# Patient Record
Sex: Female | Born: 1990 | Race: White | Hispanic: No | Marital: Single | Smoking: Current every day smoker
Health system: Southern US, Community
[De-identification: ages and names within clinical notes are randomized; demographics above are authoritative.]

## PROBLEM LIST (undated history)

## (undated) ENCOUNTER — Inpatient Hospital Stay (HOSPITAL_COMMUNITY): Payer: Self-pay

## (undated) ENCOUNTER — Emergency Department (HOSPITAL_COMMUNITY): Payer: Self-pay

## (undated) DIAGNOSIS — N83209 Unspecified ovarian cyst, unspecified side: Secondary | ICD-10-CM

## (undated) DIAGNOSIS — R87629 Unspecified abnormal cytological findings in specimens from vagina: Secondary | ICD-10-CM

## (undated) DIAGNOSIS — B977 Papillomavirus as the cause of diseases classified elsewhere: Secondary | ICD-10-CM

## (undated) DIAGNOSIS — F419 Anxiety disorder, unspecified: Secondary | ICD-10-CM

## (undated) DIAGNOSIS — B029 Zoster without complications: Secondary | ICD-10-CM

## (undated) HISTORY — PX: LACERATION REPAIR: SHX5168

## (undated) HISTORY — PX: CERVICAL BIOPSY: SHX590

---
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
## 2008-03-19 ENCOUNTER — Inpatient Hospital Stay (HOSPITAL_COMMUNITY): Admission: AD | Admit: 2008-03-19 | Discharge: 2008-03-19 | Payer: Self-pay | Admitting: Obstetrics & Gynecology

## 2008-04-01 ENCOUNTER — Ambulatory Visit (HOSPITAL_COMMUNITY): Admission: RE | Admit: 2008-04-01 | Discharge: 2008-04-01 | Payer: Self-pay | Admitting: Internal Medicine

## 2008-04-07 ENCOUNTER — Inpatient Hospital Stay (HOSPITAL_COMMUNITY): Admission: AD | Admit: 2008-04-07 | Discharge: 2008-04-07 | Payer: Self-pay | Admitting: Obstetrics & Gynecology

## 2008-04-11 ENCOUNTER — Emergency Department (HOSPITAL_COMMUNITY): Admission: EM | Admit: 2008-04-11 | Discharge: 2008-04-11 | Payer: Self-pay | Admitting: Emergency Medicine

## 2008-05-01 ENCOUNTER — Inpatient Hospital Stay (HOSPITAL_COMMUNITY): Admission: AD | Admit: 2008-05-01 | Discharge: 2008-05-01 | Payer: Self-pay | Admitting: Obstetrics & Gynecology

## 2008-07-11 ENCOUNTER — Ambulatory Visit: Payer: Self-pay | Admitting: Family Medicine

## 2008-07-11 ENCOUNTER — Inpatient Hospital Stay (HOSPITAL_COMMUNITY): Admission: AD | Admit: 2008-07-11 | Discharge: 2008-07-12 | Payer: Self-pay | Admitting: Obstetrics & Gynecology

## 2008-07-17 ENCOUNTER — Inpatient Hospital Stay (HOSPITAL_COMMUNITY): Admission: AD | Admit: 2008-07-17 | Discharge: 2008-07-17 | Payer: Self-pay | Admitting: Obstetrics & Gynecology

## 2008-07-25 ENCOUNTER — Ambulatory Visit: Payer: Self-pay | Admitting: Advanced Practice Midwife

## 2008-07-25 ENCOUNTER — Inpatient Hospital Stay (HOSPITAL_COMMUNITY): Admission: AD | Admit: 2008-07-25 | Discharge: 2008-07-25 | Payer: Self-pay | Admitting: Obstetrics & Gynecology

## 2008-07-26 ENCOUNTER — Inpatient Hospital Stay (HOSPITAL_COMMUNITY): Admission: AD | Admit: 2008-07-26 | Discharge: 2008-07-29 | Payer: Self-pay | Admitting: Obstetrics & Gynecology

## 2008-07-26 ENCOUNTER — Ambulatory Visit: Payer: Self-pay | Admitting: Advanced Practice Midwife

## 2008-08-31 ENCOUNTER — Emergency Department (HOSPITAL_COMMUNITY): Admission: EM | Admit: 2008-08-31 | Discharge: 2008-08-31 | Payer: Self-pay | Admitting: Emergency Medicine

## 2008-10-05 ENCOUNTER — Emergency Department (HOSPITAL_COMMUNITY): Admission: EM | Admit: 2008-10-05 | Discharge: 2008-10-05 | Payer: Self-pay | Admitting: Emergency Medicine

## 2010-02-08 DIAGNOSIS — N83209 Unspecified ovarian cyst, unspecified side: Secondary | ICD-10-CM

## 2010-02-08 HISTORY — DX: Unspecified ovarian cyst, unspecified side: N83.209

## 2010-05-18 LAB — CBC
HCT: 28.3 % — ABNORMAL LOW (ref 36.0–46.0)
Hemoglobin: 9.8 g/dL — ABNORMAL LOW (ref 12.0–15.0)
MCHC: 34.6 g/dL (ref 30.0–36.0)
MCV: 93.8 fL (ref 78.0–100.0)
Platelets: 301 10*3/uL (ref 150–400)
RBC: 3.01 MIL/uL — ABNORMAL LOW (ref 3.87–5.11)
RDW: 13.4 % (ref 11.5–15.5)
RDW: 13.6 % (ref 11.5–15.5)
WBC: 12.1 10*3/uL — ABNORMAL HIGH (ref 4.0–10.5)

## 2010-05-18 LAB — RPR: RPR Ser Ql: NONREACTIVE

## 2010-05-18 LAB — WET PREP, GENITAL
Clue Cells Wet Prep HPF POC: NONE SEEN
Trich, Wet Prep: NONE SEEN

## 2010-05-18 LAB — GC/CHLAMYDIA PROBE AMP, URINE: GC Probe Amp, Urine: NEGATIVE

## 2010-06-21 ENCOUNTER — Emergency Department (HOSPITAL_COMMUNITY): Payer: Self-pay

## 2010-06-21 ENCOUNTER — Emergency Department (HOSPITAL_COMMUNITY)
Admission: EM | Admit: 2010-06-21 | Discharge: 2010-06-21 | Disposition: A | Discharge status: Home / Self Care | Payer: Self-pay | Attending: Emergency Medicine | Admitting: Emergency Medicine

## 2010-06-21 DIAGNOSIS — L910 Hypertrophic scar: Secondary | ICD-10-CM | POA: Insufficient documentation

## 2010-06-21 DIAGNOSIS — M545 Low back pain, unspecified: Secondary | ICD-10-CM | POA: Insufficient documentation
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
## 2010-06-26 NOTE — Op Note (Signed)
[name], [name]                 ACCOUNT NO.:  [id number]   MEDICAL RECORD NO.:  [id number]          PATIENT TYPE:  EMS   LOCATION:  ED                           FACILITY:  [facility]   PHYSICIAN:  Antony Contras, MD     DATE OF BIRTH:  [birth year]   DATE OF PROCEDURE:  09/08/2005  DATE OF DISCHARGE:                                 OPERATIVE REPORT   PREOPERATIVE DIAGNOSIS:  Left postauricular abscess.   POSTOPERATIVE DIAGNOSIS:  Left postauricular abscess.   PROCEDURE:  Incision and drainage of left postauricular abscess.   SURGEON:  Dr. Christia Reading.   ANESTHESIA:  Topical ice application.   COMPLICATIONS:  None.   INDICATIONS:  The patient is a 20 year old white female with a several-week  history of left ear pain associated with a knot behind the ear that has  worsened.  She is found to have abscess in the postauricular region  associated with acute mastoiditis.  This did not appear to be a  subperiosteal abscess but more of a soft tissue abscess perhaps associated  with some degree of otitis externa.  She will be admitted to hospital but is  undergoing incision and drainage of the abscess in the emergency department.   FINDINGS:  Approximately 3 mL of pus was expressed from the abscess after  incision.  The area was irrigated copiously.   DESCRIPTION OF PROCEDURE:  The patient was identified in the emergency  department and informed consent was obtained from her grandfather and aunt.  The postauricular region was iced for approximately 10 minutes.  Local  anesthetic was not used because of the nature of the abscess and this  medicine being ineffective for pain control.  The patient was given 10 mg of  intravenous morphine prior to the procedure.  The postauricular region was  then prepped with Betadine.  A vertical incision was then made with a 15  blade scalpel into the abscess and pus was immediately expressed.  A culture  swab was obtained and sent for culture.  Pus was  expressed out and the  abscess cavity was copiously irrigated with saline using an angiocath on a  syringe.  40 mL of saline was used.  A quarter inch Penrose drain was  inserted into the abscess cavity and secured to the skin using 3-0 nylon  suture.  A fluff dressing was then applied.  The patient tolerated procedure  well with a fair amount of pain during the procedure.      Antony Contras, MD  Electronically Signed     DDB/MEDQ  D:  09/08/2005  T:  09/09/2005  Job:  [id number]

## 2010-06-26 NOTE — H&P (Signed)
[name], [name]                 ACCOUNT NO.:  [id number]   MEDICAL RECORD NO.:  [id number]          PATIENT TYPE:  EMS   LOCATION:  ED                           FACILITY:  [facility]   PHYSICIAN:  Antony Contras, MD     DATE OF BIRTH:  1990-03-30   DATE OF ADMISSION:  09/08/2005  DATE OF DISCHARGE:                                HISTORY & PHYSICAL   CHIEF COMPLAINT:  Left ear pain.   HISTORY OF PRESENT ILLNESS:  Patient is a 20 year old white female who  experienced pain in the left ear a few weeks ago after spending time at  [employer].  She had a little bit of clear drainage initially  that turned into a thick green drainage.  This subsided.  About a week ago,  she began feeling a knot behind the left ear and has had worsening pain  since then.  Her hearing is decreased in the left ear a bit, and she hears  her voice in the ear as an echoing sound.  She has not had anymore drainage.  She has not sought medical attention until coming to the emergency room  today.  She has been using Tylenol #3 for pain but not any other  medications.  She denies fevers, chills, or dizziness.   PAST MEDICAL HISTORY:  None.   PAST SURGICAL HISTORY:  None.   MEDICATIONS:  1.  Prozac.  2.  Vistaril.   ALLERGIES:  No known drug allergies.   FAMILY HISTORY:  Cervical cancer.   SOCIAL HISTORY:  Patient smokes less than one pack of cigarettes per day.  She denies alcohol or drug use.  Guardianship is unclear.  Her grandfather  and two aunts all are involved in her care, but her parents are not.   REVIEW OF SYSTEMS:  Negative except as listed above.   PHYSICAL EXAMINATION:  VITAL SIGNS:  Temperature 98.3, blood pressure  121/46, pulse 64, respirations 20.  GENERAL:  The patient is in clear discomfort but calm and cooperative.  She  does not want to stay in the hospital.  HEENT:  Ears:  The left external ear is slightly protruding from the head,  and there is a fluctuant area of  swelling involving the left postauricular  region just behind the ear lobe.  This is tender to touch, as is the  remainder of the postauricular region.  The tragus is not tender.  External  canal is patent with no otorrhea.  Tympanic membrane is intact with a serous  and purulent effusion.  The right ear exam is normal.  Nose:  Nasal passages  are patent with mild inferior turbinate hypertrophy and a relatively  straight septum.  The patient has a right nasal piercing.  Oral  cavity/Oropharynx:  The patient has a tongue piercing.  Mucosa is otherwise  normal.  Tonsils are 1-2+ bilaterally.  Eyes:  Extraocular movements are  intact.  Pupils are equal, round and reactive to light.  NECK:  The patient has tenderness in the left upper sternocleidomastoid  muscle, but otherwise the neck  is nontender.  There is no mass.  The patient  has some slightly enlarged lymph nodes in the left upper neck.   RADIOLOGIC EXAM:  A  temporal bone CT scan was personally reviewed and  demonstrates opacification of many of the left mastoid air cells with  several small pockets of air remaining.  The middle ear space has partial  soft tissue density.  The post auricular abscess is not clearly defined on  the scan, although there is marked swelling in this region.  There is no  bony destruction, either in the mastoid or on the mastoid cortex.   LABORATORY STUDIES:  White blood count 13.1 with 68% neutrophils, hemoglobin  13.1, platelets 440.   ASSESSMENT:  The patient is a 20 year old white female with a left  postauricular abscess associated with acute mastoiditis.  She also is  experiencing hearing loss associated with effusion.   PLAN:  The left post auricular abscess will be incised and drained in the  emergency department.  She will then be admitted to the hospital for  intravenous antibiotic therapy, consisting of Unasyn.  The pain will be  controlled with oral and IV medication.  I anticipate at least  one night in  the hospital and perhaps more if she continues to have bad symptoms.      Antony Contras, MD  Electronically Signed     DDB/MEDQ  D:  09/08/2005  T:  09/08/2005  Job:  [id number]

## 2010-06-26 NOTE — Discharge Summary (Signed)
Blake, Donna                 ACCOUNT NO.:  [id number]   MEDICAL RECORD NO.:  [id number]          PATIENT TYPE:  INP   LOCATION:  9135                          FACILITY:  [facility]   PHYSICIAN:  James A. Ashley Royalty, M.D.DATE OF BIRTH:  04-02-1990   DATE OF ADMISSION:  09/02/2006  DATE OF DISCHARGE:  09/04/2006                               DISCHARGE SUMMARY   DISCHARGE DIAGNOSES:  1. Intrauterine pregnancy at 38+ weeks gestation, delivered.  2. Term birth living child, vertex.  3. Anemia - asymptomatic.   OPERATIONS AND PROCEDURES:  OB delivery with vacuum extractor.   CONSULTATIONS:  None.   DISCHARGE MEDICATIONS:  Percocet, Motrin 60 mg.   HISTORY AND PHYSICAL:  This is a 20 year old primigravida at 38+ weeks  gestation.  Prenatal care was complicated by smoking and noncompliance  with OB/GYN care.  The patient presented to the office on the morning of  admission complaining of vague abdominal pain.  Vaginal examination  revealed 3-4 cm dilatation.  She was sent to the maternity admissions  and noted to be having regular contractions.  For the remainder of the  history and physical, please see chart.   HOSPITAL COURSE:  The patient was admitted to [facility].  Admission laboratory studies were drawn.  She went on to  labor and deliver on September 02, 2006.  The infant was 6 pound 14 ounce  female, Apgars 7 at 1 minute and 9 at 5 minutes sent to the newborn  nursery.  Delivery was accomplished by Dr. Sylvester Harder with the  vacuum extractor due to variable decelerations during the late second  stage.  The patient's postoperative course was benign.  She complained  of a sore throat on September 04, 2006.  However, subsequent strep culture  was negative.  She was felt to be stable for discharge on September 04, 2006,  and was discharged home afebrile and in satisfactory condition.   DISPOSITION:  The patient is to return to [facility] and  Obstetrics in six weeks  for postpartum evaluation.      James A. Ashley Royalty, M.D.  Electronically Signed     JAM/MEDQ  D:  10/11/2006  T:  10/11/2006  Job:  [id number]

## 2010-11-06 LAB — POCT I-STAT, CHEM 8
Calcium, Ion: 1.16
Creatinine, Ser: 0.8
HCT: 40
TCO2: 24

## 2010-11-06 LAB — COMPREHENSIVE METABOLIC PANEL
Albumin: 3.8
Alkaline Phosphatase: 65
Calcium: 8.9
Creatinine, Ser: 0.71
GFR calc Af Amer: >60
Glucose, Bld: 115 — ABNORMAL HIGH
Potassium: 3.4 — ABNORMAL LOW
Sodium: 140
Total Bilirubin: 0.6
Total Protein: 6.3

## 2010-11-06 LAB — CBC
HCT: 38.1
Hemoglobin: 12.7
MCHC: 33.3
MCV: 91.1
RDW: 15

## 2010-11-06 LAB — RAPID URINE DRUG SCREEN, HOSP PERFORMED
Amphetamines: NOT DETECTED
Benzodiazepines: POSITIVE — AB
Tetrahydrocannabinol: POSITIVE — AB

## 2010-11-06 LAB — SAMPLE TO BLOOD BANK

## 2010-11-10 LAB — URINALYSIS, ROUTINE W REFLEX MICROSCOPIC
Hgb urine dipstick: NEGATIVE
Ketones, ur: 40 — AB
Leukocytes, UA: NEGATIVE
Protein, ur: 30 — AB
Urobilinogen, UA: 0.2

## 2010-11-10 LAB — URINE MICROSCOPIC-ADD ON

## 2010-11-10 LAB — POCT PREGNANCY, URINE: Preg Test, Ur: POSITIVE

## 2010-11-15 ENCOUNTER — Emergency Department (HOSPITAL_COMMUNITY)
Admission: EM | Admit: 2010-11-15 | Discharge: 2010-11-15 | Disposition: A | Discharge status: Home / Self Care | Payer: Self-pay | Attending: Emergency Medicine | Admitting: Emergency Medicine

## 2010-11-15 DIAGNOSIS — R109 Unspecified abdominal pain: Secondary | ICD-10-CM | POA: Insufficient documentation

## 2010-11-15 DIAGNOSIS — R197 Diarrhea, unspecified: Secondary | ICD-10-CM | POA: Insufficient documentation

## 2010-11-15 DIAGNOSIS — R112 Nausea with vomiting, unspecified: Secondary | ICD-10-CM | POA: Insufficient documentation

## 2010-11-15 LAB — URINALYSIS, ROUTINE W REFLEX MICROSCOPIC
Hgb urine dipstick: NEGATIVE
Ketones, ur: NEGATIVE mg/dL
Leukocytes, UA: NEGATIVE
Protein, ur: NEGATIVE mg/dL
Urobilinogen, UA: 0.2 mg/dL (ref 0.0–1.0)

## 2010-11-15 LAB — COMPREHENSIVE METABOLIC PANEL
ALT: 10 U/L (ref 0–35)
AST: 13 U/L (ref 0–37)
Albumin: 3.8 g/dL (ref 3.5–5.2)
CO2: 24 mEq/L (ref 19–32)
Calcium: 8.5 mg/dL (ref 8.4–10.5)
Creatinine, Ser: 0.52 mg/dL (ref 0.50–1.10)
GFR calc non Af Amer: >90 mL/min (ref >90)
Sodium: 138 mEq/L (ref 135–145)
Total Protein: 6.7 g/dL (ref 6.0–8.3)

## 2010-11-15 LAB — CBC
MCH: 31.9 pg (ref 26.0–34.0)
MCV: 91.6 fL (ref 78.0–100.0)
Platelets: 267 10*3/uL (ref 150–400)
RBC: 3.95 MIL/uL (ref 3.87–5.11)
RDW: 13.1 % (ref 11.5–15.5)

## 2010-11-15 LAB — DIFFERENTIAL
Basophils Relative: 0 % (ref 0–1)
Eosinophils Absolute: 0.1 10*3/uL (ref 0.0–0.7)
Eosinophils Relative: 0 % (ref 0–5)
Monocytes Relative: 5 % (ref 3–12)
Neutrophils Relative %: 81 % — ABNORMAL HIGH (ref 43–77)

## 2010-11-16 LAB — POCT I-STAT, CHEM 8
HCT: 40 % (ref 36.0–46.0)
Hemoglobin: 13.6 g/dL (ref 12.0–15.0)
Potassium: 3.8 mEq/L (ref 3.5–5.1)
Sodium: 138 mEq/L (ref 135–145)
TCO2: 23 mmol/L (ref 0–100)

## 2010-11-23 LAB — CBC
HCT: 25.7 — ABNORMAL LOW
HCT: 31.5 — ABNORMAL LOW
Hemoglobin: 10.6 — ABNORMAL LOW
Hemoglobin: 8.4 — ABNORMAL LOW
Hemoglobin: 8.9 — ABNORMAL LOW
MCHC: 33.5
MCHC: 33.8
MCHC: 34.5
MCV: 89.2
MCV: 90.2
Platelets: 246
Platelets: 283
RBC: 3.49 — ABNORMAL LOW
RDW: 13.6
RDW: 14.3 — ABNORMAL HIGH
WBC: 16.6 — ABNORMAL HIGH

## 2010-11-23 LAB — RPR: RPR Ser Ql: NONREACTIVE

## 2010-12-25 ENCOUNTER — Encounter: Payer: Self-pay | Admitting: Adult Health

## 2010-12-25 ENCOUNTER — Emergency Department (HOSPITAL_COMMUNITY)
Admission: EM | Admit: 2010-12-25 | Discharge: 2010-12-25 | Disposition: A | Discharge status: Home / Self Care | Payer: Self-pay | Attending: Emergency Medicine | Admitting: Emergency Medicine

## 2010-12-25 DIAGNOSIS — R1032 Left lower quadrant pain: Secondary | ICD-10-CM | POA: Insufficient documentation

## 2010-12-25 DIAGNOSIS — R1110 Vomiting, unspecified: Secondary | ICD-10-CM | POA: Insufficient documentation

## 2010-12-25 DIAGNOSIS — F17200 Nicotine dependence, unspecified, uncomplicated: Secondary | ICD-10-CM | POA: Insufficient documentation

## 2010-12-25 LAB — URINALYSIS, ROUTINE W REFLEX MICROSCOPIC
Glucose, UA: NEGATIVE mg/dL
Ketones, ur: NEGATIVE mg/dL
Leukocytes, UA: NEGATIVE
Protein, ur: 30 mg/dL — AB
Urobilinogen, UA: 0.2 mg/dL (ref 0.0–1.0)

## 2010-12-25 LAB — POCT I-STAT, CHEM 8
BUN: 7 mg/dL (ref 6–23)
Chloride: 105 mEq/L (ref 96–112)
Creatinine, Ser: 0.5 mg/dL (ref 0.50–1.10)
Sodium: 142 mEq/L (ref 135–145)

## 2010-12-25 LAB — URINE MICROSCOPIC-ADD ON

## 2010-12-25 MED ORDER — KETOROLAC TROMETHAMINE 30 MG/ML IJ SOLN
30.0000 mg | Freq: Once | INTRAMUSCULAR | Status: AC
  Administered 2010-12-25: 30 mg via INTRAVENOUS
  Filled 2010-12-25: qty 1

## 2010-12-25 MED ORDER — SODIUM CHLORIDE 0.9 % IV BOLUS (SEPSIS)
500.0000 mL | Freq: Once | INTRAVENOUS | Status: AC
  Administered 2010-12-25: 500 mL via INTRAVENOUS

## 2010-12-25 MED ORDER — ONDANSETRON HCL 4 MG/2ML IJ SOLN
4.0000 mg | Freq: Once | INTRAMUSCULAR | Status: AC
  Administered 2010-12-25: 4 mg via INTRAVENOUS
  Filled 2010-12-25: qty 2

## 2010-12-25 MED ORDER — SODIUM CHLORIDE 0.9 % IV BOLUS (SEPSIS): 500.0000 mL | Freq: Once | INTRAVENOUS | Status: DC

## 2010-12-25 MED ORDER — ONDANSETRON HCL 4 MG PO TABS: 4.0000 mg | ORAL_TABLET | Freq: Four times a day (QID) | ORAL | Status: DC

## 2010-12-25 MED ORDER — KETOPROFEN 50 MG PO CAPS: 50.0000 mg | ORAL_CAPSULE | Freq: Four times a day (QID) | ORAL | Status: DC | PRN

## 2010-12-25 MED ORDER — ACETAMINOPHEN 325 MG PO TABS
650.0000 mg | ORAL_TABLET | Freq: Once | ORAL | Status: AC
  Administered 2010-12-25: 650 mg via ORAL
  Filled 2010-12-25: qty 1

## 2010-12-25 NOTE — ED Provider Notes (Signed)
Medical screening examination/treatment/procedure(s) were performed by non-physician practitioner and as supervising physician I was immediately available for consultation/collaboration.  Flint Melter, MD 12/25/10 [phone]

## 2010-12-25 NOTE — ED Notes (Signed)
Pt states that she drank "a lot" last night.  This morning when she woke up, she became really thirsty and drank a substantial amount of water and immediately threw it back up.  Diarrhea started about the same time.  Pt does not normally drink a lot.  Pain in abdomen and head 10/10.

## 2010-12-25 NOTE — ED Notes (Signed)
Pt requesting different pain medication that is not a capsule, not an NSAID. Pt offered Ibuprofen 800mg , pt declined.

## 2010-12-25 NOTE — ED Provider Notes (Signed)
History     CSN: [id number] Arrival date & time: 12/25/2010  1:27 PM   First MD Initiated Contact with Patient 12/25/10 1337      Chief Complaint  Patient presents with  . Emesis    (Consider location/radiation/quality/duration/timing/severity/associated sxs/prior treatment) The history is provided by the patient.   Pt presents to the Emergency room with left lower quadrant abdominal pain. Pt states that she drank a lot "a whoooole lot" last night. And this morning she woke up feeling nauseous, vomiting and diarrhea, and having abdominal pain. She states that she has chronic abdominal problems. Pt denies chest pain, LOC, weakness.   History reviewed. No pertinent past medical history.  Past Surgical History  Procedure Date  . Laceration repair     History reviewed. No pertinent family history.  History  Substance Use Topics  . Smoking status: Current Everyday Smoker  . Smokeless tobacco: Not on file  . Alcohol Use: Yes    OB History    Grav Para Term Preterm Abortions TAB SAB Ect Mult Living                  Review of Systems  All other systems reviewed and are negative.    Allergies  Review of patient's allergies indicates no known allergies.  Home Medications  No current outpatient prescriptions on file.  BP 110/63  Pulse 60  Temp(Src) 97.8 F (36.6 C) (Oral)  Resp 16  SpO2 100%  Physical Exam  Nursing note and vitals reviewed. Constitutional: She appears well-developed and well-nourished.  HENT:  Head: Normocephalic and atraumatic.  Eyes: Conjunctivae are normal. Pupils are equal, round, and reactive to light.  Neck: Trachea normal, normal range of motion and full passive range of motion without pain. Neck supple.  Cardiovascular: Normal rate, regular rhythm and normal pulses.   Pulmonary/Chest: Effort normal and breath sounds normal. Chest wall is not dull to percussion. She exhibits no tenderness, no crepitus, no edema, no deformity and no  retraction.  Abdominal: Soft. Normal appearance and bowel sounds are normal. There is tenderness.    Musculoskeletal: Normal range of motion.  Lymphadenopathy:       Head (right side): No submental, no submandibular, no tonsillar, no preauricular, no posterior auricular and no occipital adenopathy present.       Head (left side): No submental, no submandibular, no tonsillar, no preauricular, no posterior auricular and no occipital adenopathy present.    She has no cervical adenopathy.    She has no axillary adenopathy.  Neurological: She is alert. She has normal strength.  Skin: Skin is warm, dry and intact.  Psychiatric: She has a normal mood and affect. Her speech is normal and behavior is normal. Judgment and thought content normal. Cognition and memory are normal.    ED Course  Procedures (including critical care time)  Labs Reviewed  URINALYSIS, ROUTINE W REFLEX MICROSCOPIC - Abnormal; Notable for the following:    pH 8.5 (*)    Protein, ur 30 (*)    All other components within normal limits  URINE MICROSCOPIC-ADD ON - Abnormal; Notable for the following:    Squamous Epithelial / LPF FEW (*)    Bacteria, UA FEW (*)    All other components within normal limits  POCT PREGNANCY, URINE  POCT I-STAT, CHEM 8  POCT PREGNANCY, URINE  I-STAT, CHEM 8       I-STAT, chem 8 (Final result)   Component (Lab Inquiry)      Result Time  NA  K  CL  BUN  Creatinine, Ser    12/25/10 1548  142  3.7  105  7  0.50           Result Time  GLUCOSE  CA ION  TCO2  HGB  HCT    12/25/10 1548  87  1.14  24  13.9  41.0         Urine microscopic-add on (Final result)  Abnormal  Component (Lab Inquiry)      Result Time  Squamous Epithelial / LPF  BACTERIA  Urine-Other    12/25/10 1451  FEW (A)  FEW (A)  MUCOUS PRESENT         Urinalysis with microscopic (Final result)  Abnormal  Component (Lab Inquiry)      Result Time  Color, Urine  Appearance  Specific Gravity, Urine  pH  GLUCOSE U     12/25/10 1451  YELLOW  CLEAR  1.023  8.5 (H)  NEGATIVE           Result Time  Hgb urine dipstick  BILI UR  Ketones, ur  Protein, ur  Urobilinogen, UA    12/25/10 1451  NEGATIVE  NEGATIVE  NEGATIVE  30 (A)  0.2           Result Time  Nitrite  LEUKOCYTES    12/25/10 1451  NEGATIVE  NEGATIVE         Pregnancy, urine POC (Final result)   Component (Lab Inquiry)      Result Time  Preg Test, Ur    12/25/10 1400  NEGATIVE THE SENSITIVITY OF THIS METHODOLOGY IS >24 mIU/mL        No results found.   No diagnosis found.    MDM  No EKG. Pt has not vomited at all while in the ED.       Dorthula Matas, PA 12/25/10 [phone]

## 2011-01-02 ENCOUNTER — Inpatient Hospital Stay (HOSPITAL_COMMUNITY): Payer: Self-pay

## 2011-01-02 ENCOUNTER — Inpatient Hospital Stay (HOSPITAL_COMMUNITY)
Admission: AD | Admit: 2011-01-02 | Discharge: 2011-01-02 | Disposition: A | Discharge status: Home / Self Care | Payer: Self-pay | Source: Ambulatory Visit | Attending: Obstetrics & Gynecology | Admitting: Obstetrics & Gynecology

## 2011-01-02 ENCOUNTER — Encounter (HOSPITAL_COMMUNITY): Payer: Self-pay | Admitting: *Deleted

## 2011-01-02 DIAGNOSIS — R109 Unspecified abdominal pain: Secondary | ICD-10-CM | POA: Insufficient documentation

## 2011-01-02 DIAGNOSIS — N83209 Unspecified ovarian cyst, unspecified side: Secondary | ICD-10-CM | POA: Insufficient documentation

## 2011-01-02 DIAGNOSIS — IMO0002 Reserved for concepts with insufficient information to code with codable children: Secondary | ICD-10-CM | POA: Insufficient documentation

## 2011-01-02 LAB — URINALYSIS, ROUTINE W REFLEX MICROSCOPIC
Glucose, UA: NEGATIVE mg/dL
Hgb urine dipstick: NEGATIVE
Leukocytes, UA: NEGATIVE
pH: 6.5 (ref 5.0–8.0)

## 2011-01-02 LAB — WET PREP, GENITAL: Trich, Wet Prep: NONE SEEN

## 2011-01-02 LAB — POCT PREGNANCY, URINE: Preg Test, Ur: NEGATIVE

## 2011-01-02 MED ORDER — KETOROLAC TROMETHAMINE 60 MG/2ML IM SOLN
60.0000 mg | Freq: Once | INTRAMUSCULAR | Status: AC
  Administered 2011-01-02: 60 mg via INTRAMUSCULAR
  Filled 2011-01-02: qty 2

## 2011-01-02 MED ORDER — HYDROMORPHONE HCL PF 1 MG/ML IJ SOLN
1.0000 mg | Freq: Once | INTRAMUSCULAR | Status: AC
  Administered 2011-01-02: 1 mg via INTRAMUSCULAR
  Filled 2011-01-02: qty 1

## 2011-01-02 MED ORDER — OXYCODONE-ACETAMINOPHEN 5-325 MG PO TABS: 1.0000 | ORAL_TABLET | ORAL | Status: AC | PRN

## 2011-01-02 NOTE — ED Provider Notes (Signed)
History     Chief Complaint  Patient presents with  . Abdominal Pain  . Back Pain   HPI  Intercourse 2 hrs ago and started having sharp pain in lower abd, back and rectum. Pain continues and is constant. Worse when sits. Denies n/v/d. Denies vag bleeding. Some white vag d/c.   Past Medical History  Diagnosis Date  . No pertinent past medical history     Past Surgical History  Procedure Date  . Laceration repair   . No past surgeries     Family History  Problem Relation Age of Onset  . Cancer Mother   . Cancer Maternal Aunt     History  Substance Use Topics  . Smoking status: Current Everyday Smoker  . Smokeless tobacco: Not on file  . Alcohol Use: Yes     occ    Allergies: No Known Allergies   (Not in a hospital admission)  Review of Systems  Gastrointestinal: Positive for abdominal pain (lower pelvic).  Genitourinary:       Vaginal pain  Musculoskeletal: Positive for back pain.   Physical Exam   Blood pressure 117/54, pulse 65, temperature 98.7 F (37.1 C), resp. rate 20, height 5\' 1"  (1.549 m), weight 52.889 kg (116 lb 9.6 oz).  Physical Exam  Constitutional: She is oriented to person, place, and time. She appears well-developed and well-nourished. She appears distressed (uncomfortable appearing).  HENT:  Head: Normocephalic.  Neck: Normal range of motion. Neck supple.  Cardiovascular: Normal rate, regular rhythm and normal heart sounds.  Exam reveals no gallop and no friction rub.   No murmur heard. Respiratory: Effort normal and breath sounds normal. No respiratory distress.  GI: She exhibits no mass. There is tenderness (suprapubic). There is no rebound, no guarding and no CVA tenderness.  Genitourinary: Rectal exam shows tenderness. Cervix exhibits no motion tenderness and no discharge. Vaginal discharge found.  Musculoskeletal: Normal range of motion.  Neurological: She is alert and oriented to person, place, and time.  Skin: Skin is warm and  dry.  Psychiatric: She has a normal mood and affect.    MAU Course  Procedures Wet prep - few yeast, neg trich, neg clue UA - neg Ultrasound -   Assessment and Plan  Vaginal Pain  Report given to T. Burleson who assumes care of patient.  [facility] 01/02/2011, 6:24 AM

## 2011-01-02 NOTE — ED Provider Notes (Signed)
Agree with above note.  Shatasha Lambing 01/02/2011 10:38 PM

## 2011-01-02 NOTE — ED Provider Notes (Signed)
History     Chief Complaint  Patient presents with  . Abdominal Pain  . Back Pain   HPI Assumed care from previous provider.  Pt still c/o pain in pelvis after [place].  ROS Physical Exam   Blood pressure 117/54, pulse 65, temperature 98.7 F (37.1 C), resp. rate 20, height 5\' 1"  (1.549 m), weight 116 lb 9.6 oz (52.889 kg).  Physical Exam US Transvaginal Non-ob  01/02/2011  *RADIOLOGY REPORT*  Clinical Data: Vaginal bleeding pelvic and low back pain.  TRANSABDOMINAL AND TRANSVAGINAL ULTRASOUND OF PELVIS Technique:  Both transabdominal and transvaginal ultrasound examinations of the pelvis were performed. Transabdominal technique was performed for global imaging of the pelvis including uterus, ovaries, adnexal regions, and pelvic cul-de-sac.  Comparison: None.   It was necessary to proceed with endovaginal exam following the transabdominal exam to visualize the endometrium.  Findings:  Uterus: 87 mm x 41 mm x 63 mm with normal myometrial echotexture.  Endometrium: 10 mm.  This is within normal limits for age.  No focal lesions.  Right ovary:  32 mm x 16 mm x 17 mm.  Physiologic appearance.  Left ovary: 38 mm x 15 mm x 28 mm.  Normal physiologic appearance.  Other findings: Heterogeneous fluid is present in the pouch of Douglas, with echogenic material, probably representing retracted clot.  Although nonspecific, this probably relates to ruptured ovarian cyst.  IMPRESSION:  1.  Normal appearance of the uterus and ovaries. 2.  Small amount of complex fluid in the pouch of Douglas with an appearance that suggests retracted clot, most frequently associated with ruptured ovarian cyst and small hemoperitoneum.This presumes the patient is not currently pregnant.  Original Report Authenticated By: Andreas Newport, M.D.   US Pelvis Complete  01/02/2011  *RADIOLOGY REPORT*  Clinical Data: Vaginal bleeding pelvic and low back pain.  TRANSABDOMINAL AND TRANSVAGINAL ULTRASOUND OF PELVIS Technique:  Both  transabdominal and transvaginal ultrasound examinations of the pelvis were performed. Transabdominal technique was performed for global imaging of the pelvis including uterus, ovaries, adnexal regions, and pelvic cul-de-sac.  Comparison: None.   It was necessary to proceed with endovaginal exam following the transabdominal exam to visualize the endometrium.  Findings:  Uterus: 87 mm x 41 mm x 63 mm with normal myometrial echotexture.  Endometrium: 10 mm.  This is within normal limits for age.  No focal lesions.  Right ovary:  32 mm x 16 mm x 17 mm.  Physiologic appearance.  Left ovary: 38 mm x 15 mm x 28 mm.  Normal physiologic appearance.  Other findings: Heterogeneous fluid is present in the pouch of Douglas, with echogenic material, probably representing retracted clot.  Although nonspecific, this probably relates to ruptured ovarian cyst.  IMPRESSION:  1.  Normal appearance of the uterus and ovaries. 2.  Small amount of complex fluid in the pouch of Douglas with an appearance that suggests retracted clot, most frequently associated with ruptured ovarian cyst and small hemoperitoneum.This presumes the patient is not currently pregnant.  Original Report Authenticated By: Andreas Newport, M.D.   MAU Course  Procedures   Assessment and Plan  A:  Ruptured Ovarian Cyst       Dyspareunia, related to #1 P:  Consulted Dr Jolayne Panther      Will d/c home with analgesics.      Follow up in clinic as needed.  [facility] 01/02/2011, 8:28 AM

## 2011-01-02 NOTE — ED Notes (Signed)
Pt refused shot. Wants something for pain by mouth

## 2011-01-02 NOTE — Progress Notes (Signed)
G2P2 Was having intercourse 2 hrs ago and started having sharp pain in lower abd, back and rectum. Pain continues and is constant. Worse when sits. Denies n/v/d. Denies vag bleeding. Some white vag d/c.

## 2011-01-02 NOTE — Progress Notes (Signed)
[address] CNM notified of pt's request for pain med. Will see pt
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
## 2011-01-02 NOTE — ED Notes (Signed)
Pt c/o pelvic pain starting while having sex x 45 minutes ago. Pt states pain sharp and sudden, not relieved by anything. Denies bleeding.

## 2011-01-02 NOTE — ED Notes (Signed)
[id number] Threasa Heads CNM in to see pt

## 2011-01-05 LAB — GC/CHLAMYDIA PROBE AMP, GENITAL: Chlamydia, DNA Probe: POSITIVE — AB

## 2011-01-08 NOTE — ED Provider Notes (Signed)
Agree with above note.  [name] 01/08/2011 10:42 AM

## 2011-02-02 ENCOUNTER — Inpatient Hospital Stay (HOSPITAL_COMMUNITY): Payer: Self-pay

## 2011-02-02 ENCOUNTER — Inpatient Hospital Stay (HOSPITAL_COMMUNITY)
Admission: AD | Admit: 2011-02-02 | Discharge: 2011-02-03 | Disposition: A | Discharge status: Home / Self Care | Payer: Self-pay | Source: Ambulatory Visit | Attending: Family Medicine | Admitting: Family Medicine

## 2011-02-02 ENCOUNTER — Encounter (HOSPITAL_COMMUNITY): Payer: Self-pay | Admitting: *Deleted

## 2011-02-02 DIAGNOSIS — R109 Unspecified abdominal pain: Secondary | ICD-10-CM | POA: Insufficient documentation

## 2011-02-02 DIAGNOSIS — N83209 Unspecified ovarian cyst, unspecified side: Secondary | ICD-10-CM | POA: Insufficient documentation

## 2011-02-02 DIAGNOSIS — N949 Unspecified condition associated with female genital organs and menstrual cycle: Secondary | ICD-10-CM | POA: Insufficient documentation

## 2011-02-02 DIAGNOSIS — R102 Pelvic and perineal pain: Secondary | ICD-10-CM

## 2011-02-02 HISTORY — DX: Unspecified ovarian cyst, unspecified side: N83.209

## 2011-02-02 NOTE — Progress Notes (Signed)
Pt reports she was having sex and started having lower abd pain. States they stopped having sex but the pain has continued to worsen. States this same thing happened in November and she came here and we told her she had an ovarian cyst. States the pain went away after she came here in November and she has had intercourse without pain until tonight.denies vaginal bleeding. Condoms for birth control. LMP 01/18/2011

## 2011-02-03 LAB — CBC
HCT: 34.6 % — ABNORMAL LOW (ref 36.0–46.0)
Hemoglobin: 11.9 g/dL — ABNORMAL LOW (ref 12.0–15.0)
MCHC: 34.4 g/dL (ref 30.0–36.0)
RBC: 3.74 MIL/uL — ABNORMAL LOW (ref 3.87–5.11)
WBC: 15.5 10*3/uL — ABNORMAL HIGH (ref 4.0–10.5)

## 2011-02-03 LAB — URINALYSIS, ROUTINE W REFLEX MICROSCOPIC
Bilirubin Urine: NEGATIVE
Hgb urine dipstick: NEGATIVE
Protein, ur: NEGATIVE mg/dL
Urobilinogen, UA: 1 mg/dL (ref 0.0–1.0)

## 2011-02-03 LAB — RAPID URINE DRUG SCREEN, HOSP PERFORMED
Amphetamines: NOT DETECTED
Benzodiazepines: NOT DETECTED
Cocaine: NOT DETECTED
Opiates: NOT DETECTED
Tetrahydrocannabinol: NOT DETECTED

## 2011-02-03 MED ORDER — NAPROXEN SODIUM 550 MG PO TABS: 550.0000 mg | ORAL_TABLET | Freq: Two times a day (BID) | ORAL | Status: DC

## 2011-02-03 MED ORDER — HYDROMORPHONE HCL PF 1 MG/ML IJ SOLN
1.0000 mg | Freq: Once | INTRAMUSCULAR | Status: AC
  Administered 2011-02-03: 1 mg via INTRAMUSCULAR
  Filled 2011-02-03: qty 1

## 2011-02-03 NOTE — ED Provider Notes (Signed)
History   Pt presents today c/o sudden onset, severe abd pain that began during intercourse. She states she has had similar pain in the past when she had a ruptured ovarian cyst. She denies fever. She is currently refusing pelvic exam or to give a urine specimen.  Chief Complaint  Patient presents with  . Abdominal Pain   HPI  OB History    Grav Para Term Preterm Abortions TAB SAB Ect Mult Living   2 2 2  0 0 0 0 0 0 2      Past Medical History  Diagnosis Date  . Ovarian cyst 2012    Past Surgical History  Procedure Date  . Laceration repair   . No past surgeries     Family History  Problem Relation Age of Onset  . Cancer Mother   . Cancer Maternal Aunt     History  Substance Use Topics  . Smoking status: Current Everyday Smoker  . Smokeless tobacco: Not on file  . Alcohol Use: Yes     occ    Allergies: No Known Allergies  Prescriptions prior to admission  Medication Sig Dispense Refill  . oxyCODONE-acetaminophen (PERCOCET) 5-325 MG per tablet Take 1 tablet by mouth every 4 (four) hours as needed. For pain         Review of Systems  Constitutional: Negative for fever.  Eyes: Negative for blurred vision.  Cardiovascular: Negative for chest pain and palpitations.  Gastrointestinal: Positive for abdominal pain. Negative for nausea, vomiting, diarrhea and constipation.  Genitourinary: Negative for dysuria, urgency, frequency and hematuria.  Neurological: Negative for dizziness and headaches.  Psychiatric/Behavioral: Negative for depression and suicidal ideas.   Physical Exam   Blood pressure 132/62, pulse 88, temperature 98.7 F (37.1 C), resp. rate 18, last menstrual period 01/18/2011, SpO2 98.00%.  Physical Exam  Nursing note and vitals reviewed. Constitutional: She is oriented to person, place, and time. She appears well-developed and well-nourished. She appears distressed.  HENT:  Head: Normocephalic and atraumatic.  Eyes: EOM are normal. Pupils are  equal, round, and reactive to light.  GI: Soft. She exhibits no distension and no mass. There is tenderness. There is no rebound and no guarding.  Neurological: She is alert and oriented to person, place, and time. No cranial nerve deficit.  Skin: Skin is warm and dry. She is not diaphoretic.  Psychiatric: She has a normal mood and affect. Her behavior is normal. Judgment and thought content normal.    MAU Course  Procedures  Pt refuses pelvic exam. GC/Chlamydia cultures obtained from urine.  Pt pain improved following IM Dilaudid.  Results for orders placed during the hospital encounter of 02/02/11 (from the past 24 hour(s))  POCT PREGNANCY, URINE     Status: Normal   Collection Time   02/02/11 11:48 PM      Component Value Range   Preg Test, Ur NEGATIVE    CBC     Status: Abnormal   Collection Time   02/03/11 12:35 AM      Component Value Range   WBC 15.5 (*) 4.0 - 10.5 (K/uL)   RBC 3.74 (*) 3.87 - 5.11 (MIL/uL)   Hemoglobin 11.9 (*) 12.0 - 15.0 (g/dL)   HCT 16.1 (*) 09.6 - 46.0 (%)   MCV 92.5  78.0 - 100.0 (fL)   MCH 31.8  26.0 - 34.0 (pg)   MCHC 34.4  30.0 - 36.0 (g/dL)   RDW 04.5  40.9 - 81.1 (%)   Platelets 230  150 -  400 (K/uL)  URINALYSIS, ROUTINE W REFLEX MICROSCOPIC     Status: Abnormal   Collection Time   02/03/11 12:45 AM      Component Value Range   Color, Urine YELLOW  YELLOW    APPearance CLEAR  CLEAR    Specific Gravity, Urine 1.020  1.005 - 1.030    pH 6.0  5.0 - 8.0    Glucose, UA NEGATIVE  NEGATIVE (mg/dL)   Hgb urine dipstick NEGATIVE  NEGATIVE    Bilirubin Urine NEGATIVE  NEGATIVE    Ketones, ur 15 (*) NEGATIVE (mg/dL)   Protein, ur NEGATIVE  NEGATIVE (mg/dL)   Urobilinogen, UA 1.0  0.0 - 1.0 (mg/dL)   Nitrite NEGATIVE  NEGATIVE    Leukocytes, UA NEGATIVE  NEGATIVE    US Transvaginal Non-ob  02/03/2011  *RADIOLOGY REPORT*  Clinical Data: Pelvic pain after intercourse.  TRANSVAGINAL ULTRASOUND OF PELVIS  Technique:  Transvaginal ultrasound  examination of the pelvis was performed including evaluation of the uterus, ovaries, adnexal regions, and pelvic cul-de-sac.  Comparison:  Pelvic ultrasound performed 01/02/2011  Findings:  Uterus:  Normal in size and appearance; measures 8.5 x 5.1 x 5.9 cm.  Endometrium: Normal in thickness and appearance; measures 0.7 cm in thickness.  Right ovary: Normal appearance/no adnexal mass; measures 3.2 x 2.1 x 2.4 cm.  A 1.7 cm focus of increased echogenicity within the right ovary is thought to reflect a resolving corpus luteal cyst. Given the small amount of fluid at the right adnexa, a ruptured cyst cannot be entirely excluded.  Left ovary: Normal appearance/no adnexal mass; measures 2.8 x 1.3 x 1.9 cm.  Other Findings:  Small amount of free fluid noted within the pelvis.  Echogenic material is again noted at the pouch of [place], possibly reflecting retracted clot.  IMPRESSION:  1.  Small amount of free fluid noted at the right adnexa and within the pelvic cul-de-sac; a recurrent ruptured right ovarian cyst cannot be entirely excluded. 2.  Focus of increased echogenicity within the right ovary is thought to reflect a resolving corpus luteal cyst. 3.  Uterus unremarkable in appearance.  The patient demonstrated severe pelvic tenderness during the exam. Suggest clinical correlation to exclude infection.  Original Report Authenticated By: Tonia Ghent, M.D.     Assessment and Plan  Pelvic pain: discussed with pt at length. She will f/u in the GYN clinic. Discussed diet, activity, risks, and precautions. Will give Rx for anaprox ds. Advised that pt needs PCP and should consider OCPs for long-term tx of recurrent ovarian cysts. Pt states she will not use OCPs because she uses condoms and does not need them. Explained to pt that there is nothing more I can do since she refuses physical examination and tx offered. She request surgery. Pt advised that she does not need surgery. Pt will be dc'd with anaprox ds for pain  and she has been instructed to find a PCP. Also advised that she avoid intercourse for 1wk.   Clinton Gallant. Rice III, DrHSc, MPAS, PA-C  02/03/2011, 12:54 AM   Henrietta Hoover, PA 02/03/11 0215  Henrietta Hoover, PA 02/03/11 7829

## 2011-02-03 NOTE — ED Provider Notes (Signed)
Chart reviewed and agree with management and plan.  

## 2011-04-19 ENCOUNTER — Emergency Department (HOSPITAL_COMMUNITY)
Admission: EM | Admit: 2011-04-19 | Discharge: 2011-04-19 | Disposition: A | Discharge status: Home / Self Care | Payer: Self-pay | Attending: Emergency Medicine | Admitting: Emergency Medicine

## 2011-04-19 ENCOUNTER — Encounter (HOSPITAL_COMMUNITY): Payer: Self-pay | Admitting: Emergency Medicine

## 2011-04-19 DIAGNOSIS — F17200 Nicotine dependence, unspecified, uncomplicated: Secondary | ICD-10-CM | POA: Insufficient documentation

## 2011-04-19 DIAGNOSIS — N63 Unspecified lump in unspecified breast: Secondary | ICD-10-CM | POA: Insufficient documentation

## 2011-04-19 MED ORDER — TRAMADOL HCL 50 MG PO TABS: 50.0000 mg | ORAL_TABLET | Freq: Four times a day (QID) | ORAL | Status: AC | PRN

## 2011-04-19 NOTE — ED Provider Notes (Signed)
Medical screening examination/treatment/procedure(s) were performed by non-physician practitioner and as supervising physician I was immediately available for consultation/collaboration.  Isack Lavalley R. Willadeen Colantuono, MD 04/19/11 2018 

## 2011-04-19 NOTE — ED Notes (Signed)
No redness noted to breast

## 2011-04-19 NOTE — ED Notes (Signed)
PA in to assess 

## 2011-04-19 NOTE — Discharge Instructions (Signed)
Breast Tenderness Breast tenderness is a common complaint made by women of all ages. It is also called mastalgia or mastodynia, which means breast pain. The condition can range from mild discomfort to severe pain. It has a variety of causes. Your caregiver will find out the likely cause of your breast tenderness by examining your breasts, asking you about symptoms and perhaps ordering some tests. Breast tenderness usually does not mean you have breast cancer. CAUSES  Breast tenderness has many possible causes. They include:  Premenstrual changes. A week to 10 days before your period, your breasts might ache or feel tender.   Other hormonal causes. These include:   When sexual and physical traits mature (puberty).   Pregnancy.   The time right before and the year after menopause (perimenopause).   The day when it has been 12 months since your last period (menopause).   Large breasts.   Infection (also called mastitis).   Birth control pills.   Breastfeeding. Tenderness can occur if the breasts are overfull with milk or if a milk duct is blocked.   Injury.   Fibrocystic breast changes. This is not cancer (benign). It causes painful breasts that feel lumpy.   Fluid-filled sacs (cysts). Often cysts can be drained in your healthcare provider's office.   Fibroadenoma. This is a tumor that is not cancerous.   Medication side effects. Blood pressure drugs and diuretics (which increase urine flow) sometimes cause breast tenderness.   Previous breast surgery, such as a breast reduction.   Breast cancer. Cancer is rarely the reason breasts are tender. In most women, tenderness is caused by something else.  DIAGNOSIS  Several methods can be used to find out why your breasts are tender. They include:  Visual inspection of the breasts.   Examination by hand.   Tests, such as:   Mammogram.   Ultrasound.   Biopsy.   Lab test of any fluid coming from the nipple.   Blood tests.     MRI.  TREATMENT  Treatment is directed to the cause of the breast tenderness from doing nothing for minor discomfort, wearing a good support bra but also may include:  Taking over-the-counter medicines for pain or discomfort as directed by your caregiver.   Prescription medicine for breast tenderness related to:   Premenstrual.   Fibrocystic.   Puberty.   Pregnancy.   Menopause.   Previous breast surgery.   Large breasts.   Antibiotics for infection.   Birth control pills for fibrocystic and premenstrual changes.   More frequent feedings or pumping of the breasts and warm compresses for breast engorgement when nursing.   Cold and warm compresses and a good support bra for most breast injuries.   Breast cysts are sometimes drained with a needle (aspiration) or removed with minor surgery.   Fibroadenomas are usually removed with minor surgery.   Changing or stopping the medicine when it is responsible for causing the breast tenderness.   When breast cancer is present with or without causing pain, it is usually treated with major surgery (with or without radiation) and chemotherapy.  HOME CARE INSTRUCTIONS  Breast tenderness often can be handled at home. You can try:  Getting fitted for a new bra that provides more support, especially during exercise.   Wearing a more supportive or sports bra while sleeping when your breasts are very tender.   If you have a breast injury, using an ice pack for 15 to 20 minutes. Wrap the pack in a   towel. Do not put the ice pack directly on your breast.   If your breasts are too full of milk as a result of breastfeeding, try:   Expressing milk either by hand or with a breast pump.   Applying a warm compress for relief.   Taking over-the-counter pain relievers, if this is OK with your caregiver.   Taking medicine that your caregiver prescribes. These might include antibiotics or birth control pills.  Over the long term, your  breast tenderness might be eased if you:  Cut down on caffeine.   Reduce the amount of fat in your diet.  Also, learn how to do breast examinations at home. This will help you tell when you have an unusual growth or lump that could cause tenderness. And keep a log of the days and times when your breasts are most tender. This will help you and your caregiver find the right solution. SEEK MEDICAL CARE IF:   Any part of your breast is hard, red and hot to the touch. This could be a sign of infection.   Fluid is coming out of your nipples (and you are not breastfeeding). Especially watch for blood or pus.   You have a fever as well as breast tenderness.   You have a new or painful lump in your breast that remains after your period ends.   You have tried to take care of the pain at home, but it has not gone away.   Your breast pain is getting worse. Or, the pain is making it hard to do the things you usually do during your day.  Document Released: 01/08/2008 Document Revised: 01/14/2011 Document Reviewed: 01/08/2008 [employer] Patient Information 2012 [employer]. 

## 2011-04-19 NOTE — ED Provider Notes (Signed)
History     CSN: [id number]  Arrival date & time 04/19/11  1258   First MD Initiated Contact with Patient 04/19/11 1528      Chief Complaint  Patient presents with  . Breast Pain    (Consider location/radiation/quality/duration/timing/severity/associated sxs/prior treatment) The history is provided by the patient.   the patient is a healthy 21 year old female who presents to the emergency department with a chief complaint of left breast pain for the last 3 days. There was no injury. She noticed a lump on her left breast that was painful only on palpation, though it has now migrated and is painful at rest. There is no associated fever, chills, breast discharge, overlying skin changes. She does not think that she is pregnant. She has taken Tylenol and ibuprofen without improvement.  Past Medical History  Diagnosis Date  . Ovarian cyst 2012    Past Surgical History  Procedure Date  . Laceration repair   . No past surgeries     Family History  Problem Relation Age of Onset  . Cancer Mother   . Cancer Maternal Aunt     History  Substance Use Topics  . Smoking status: Current Everyday Smoker  . Smokeless tobacco: Not on file  . Alcohol Use: Yes     occ    OB History    Grav Para Term Preterm Abortions TAB SAB Ect Mult Living   2 2 2  0 0 0 0 0 0 2      Review of Systems 10 systems reviewed and are negative for acute change except as noted in the HPI.  Allergies  Review of patient's allergies indicates no known allergies.  Home Medications   Current Outpatient Rx  Name Route Sig Dispense Refill  . ACETAMINOPHEN 500 MG PO TABS Oral Take 1,000 mg by mouth every 6 (six) hours as needed. Pain/fever      BP 110/56  Pulse 51  Temp(Src) 98 F (36.7 C) (Oral)  Resp 16  SpO2 100%  Physical Exam  Nursing note and vitals reviewed. Constitutional: She is oriented to person, place, and time. She appears well-developed and well-nourished.  HENT:  Head: Normocephalic  and atraumatic.  Neck: Normal range of motion. Neck supple.  Cardiovascular: Normal rate, regular rhythm and intact distal pulses.   Pulmonary/Chest: Effort normal. No respiratory distress. Right breast exhibits no inverted nipple. Left breast exhibits no inverted nipple.         2 small mobile, rubbery breast nodules at 10 and 12 o clock on the left breast, as shown on diagram. Right breast without any nodules. Breasts are symmetric and there are no visible abnormalities or overlying skin changes to affected areas. No dimpling. No nipple discharge.   Abdominal: Soft. She exhibits no distension. There is no tenderness.  Musculoskeletal: She exhibits no edema and no tenderness.  Neurological: She is alert and oriented to person, place, and time.  Skin: Skin is warm and dry. No rash noted. No erythema.  Psychiatric: She has a normal mood and affect.    ED Course  Procedures (including critical care time)   Labs Reviewed  POCT PREGNANCY, URINE   No results found.   1. Breast lump in female       MDM  Left breast tenderness with nodules most suspicious for cystic nodules. Negative pregnancy test in ED. Advised f/u with breast clinic. Will give ultram for pain. Pt requesting narcotic pain medication- i discussed with her that her presenting complaint does not  warrant a narcotic rx.     Shaaron Adler, [place] 04/19/11 1555

## 2011-04-19 NOTE — ED Notes (Signed)
In to discharge pt - unable to locate - positive gown sign

## 2011-04-19 NOTE — ED Notes (Signed)
Lump to left breastt that she noticed  On Friday if she squeezed it states has now moved down she states

## 2011-05-12 ENCOUNTER — Emergency Department (HOSPITAL_COMMUNITY): Payer: Self-pay

## 2011-05-12 ENCOUNTER — Emergency Department (HOSPITAL_COMMUNITY)
Admission: EM | Admit: 2011-05-12 | Discharge: 2011-05-12 | Disposition: A | Discharge status: Home / Self Care | Payer: Self-pay | Attending: Emergency Medicine | Admitting: Emergency Medicine

## 2011-05-12 DIAGNOSIS — W230XXA Caught, crushed, jammed, or pinched between moving objects, initial encounter: Secondary | ICD-10-CM | POA: Insufficient documentation

## 2011-05-12 DIAGNOSIS — M79609 Pain in unspecified limb: Secondary | ICD-10-CM | POA: Insufficient documentation

## 2011-05-12 DIAGNOSIS — S6000XA Contusion of unspecified finger without damage to nail, initial encounter: Secondary | ICD-10-CM | POA: Insufficient documentation

## 2011-05-12 DIAGNOSIS — F17200 Nicotine dependence, unspecified, uncomplicated: Secondary | ICD-10-CM | POA: Insufficient documentation

## 2011-05-12 MED ORDER — IBUPROFEN 800 MG PO TABS: 800.0000 mg | ORAL_TABLET | Freq: Three times a day (TID) | ORAL | Status: AC | PRN

## 2011-05-12 MED ORDER — HYDROCODONE-ACETAMINOPHEN 5-325 MG PO TABS
1.0000 | ORAL_TABLET | Freq: Once | ORAL | Status: AC
  Administered 2011-05-12: 1 via ORAL
  Filled 2011-05-12: qty 1

## 2011-05-12 NOTE — ED Notes (Signed)
Cleaned wound with sterile water and soap.   Put bacitracin on and a bandaid.

## 2011-05-12 NOTE — ED Notes (Signed)
Smashed her finger in the door  Rt index finger

## 2011-05-12 NOTE — ED Provider Notes (Signed)
Medical screening examination/treatment/procedure(s) were performed by non-physician practitioner and as supervising physician I was immediately available for consultation/collaboration.  Neeya Prigmore K Linker, MD 05/12/11 1106 

## 2011-05-12 NOTE — ED Notes (Signed)
[place], PA at bedside for evaluation. 

## 2011-05-12 NOTE — ED Provider Notes (Signed)
History     CSN: [id number]  Arrival date & time 05/12/11  0911   First MD Initiated Contact with Patient 05/12/11 [phone]      Chief Complaint  Patient presents with  . Hand Pain    (Consider location/radiation/quality/duration/timing/severity/associated sxs/prior treatment) HPI Comments: Patient states she accidentally slammed her right index finger in a car door.  Reports it is throbbing and constant.  Pain is worse with movement.  Denies weakness or numbness.  Denies other injury.  Last tetanus vx 2009.    Patient is a 21 y.o. female presenting with hand pain. The history is provided by the patient.  Hand Pain Pertinent negatives include no numbness or weakness.    Past Medical History  Diagnosis Date  . Ovarian cyst 2012    Past Surgical History  Procedure Date  . Laceration repair   . No past surgeries     Family History  Problem Relation Age of Onset  . Cancer Mother   . Cancer Maternal Aunt     History  Substance Use Topics  . Smoking status: Current Everyday Smoker  . Smokeless tobacco: Not on file  . Alcohol Use: Yes     occ    OB History    Grav Para Term Preterm Abortions TAB SAB Ect Mult Living   2 2 2  0 0 0 0 0 0 2      Review of Systems  Neurological: Negative for weakness and numbness.  All other systems reviewed and are negative.    Allergies  Review of patient's allergies indicates no known allergies.  Home Medications  No current outpatient prescriptions on file.  BP 102/41  Pulse 92  Temp 98.1 F (36.7 C)  Resp 16  Physical Exam  Nursing note and vitals reviewed. Constitutional: She is oriented to person, place, and time. She appears well-developed and well-nourished. No distress.  HENT:  Head: Normocephalic and atraumatic.  Neck: Neck supple.  Pulmonary/Chest: Effort normal.  Musculoskeletal:       Right hand: She exhibits tenderness and laceration. She exhibits normal capillary refill and no deformity. normal sensation  noted. Normal strength noted.       Hands:      Pt moves all joints of finger, sensation intact, capillary refill < 2 seconds.   Neurological: She is alert and oriented to person, place, and time.  Skin: She is not diaphoretic.    ED Course  Procedures (including critical care time)  Labs Reviewed - No data to display Dg Finger Index Right  05/12/2011  *RADIOLOGY REPORT*  Clinical Data: Car door injury  RIGHT INDEX FINGER 2+V  Comparison: None.  Findings: Normal alignment.  No fracture.  Preserved joint spaces. No radiographic foreign body.  IMPRESSION: No acute osseous finding.  Original Report Authenticated By: Judie Petit. Ruel Favors, M.D.     1. Contusion of finger       MDM  Patient slammed finger in car door this morning.  Very superficial laceration that is vertical, just below nail.  Cleaned and dressed in ED.  Xray is negative for fracture.  Tetanus is up to date.  Discussed wound care.  Return precautions given.  Patient verbalizes understanding and agrees with plan.          Rise Patience, [place] 05/12/11 1055

## 2011-05-12 NOTE — Discharge Instructions (Signed)
Read the information below.  Please keep the cut on your hand clean and covered, with a thin layer of antibiotic ointment on it while it heals.  Return to the ER immediately if you develop redness, swelling, pus draining from the wound, complete darkness of your fingernail, or fevers greater than 100.4.  You may return to the ER at any time for worsening condition or any new symptoms that concern you.   Contusion A contusion is a deep bruise. Contusions happen when an injury causes bleeding under the skin. Signs of bruising include pain, puffiness (swelling), and discolored skin. The contusion may turn blue, purple, or yellow. HOME CARE   Put ice on the injured area.   Put ice in a plastic bag.   Place a towel between your skin and the bag.   Leave the ice on for 15 to 20 minutes, 3 to 4 times a day.   Only take medicine as told by your doctor.   Rest the injured area.   If possible, raise (elevate) the injured area to lessen puffiness.  GET HELP RIGHT AWAY IF:   You have more bruising or puffiness.   You have pain that is getting worse.   Your puffiness or pain is not helped by medicine.  MAKE SURE YOU:   Understand these instructions.   Will watch your condition.   Will get help right away if you are not doing well or get worse.  Document Released: 07/14/2007 Document Revised: 01/14/2011 Document Reviewed: 11/30/2010 ExitCare Patient Information 2012 [place], [facility]     If you have no primary doctor, here are some resources that may be helpful:  Medicaid-accepting [facility] Providers:   - [facility]- [address]      [phone]      Mon-Fri 9am-7pm, Sat 9am-1pm   - [facility]- [address] [place], [address] [place]      [phone]   - [facility]- [address] [place]      [phone]   [facility]- [address]      [phone]   - Renaye Rakers- [address] [place],  [address]      [phone]      Only accepts [place] patients       after they have her name applied to their card   Self Pay (no insurance) in [place]:   - Sickle Cell Patients: Dr Willey Blade, [facility]      [address] [place]      [phone]   - [facility] Connect808 [phone]   - Physician Referral Service- [phone]   - [facility]- [address] [place]      [phone]   [facility] [place]- [address]   - [facility]- see information above      (Speak to [employer] if you do not have insurance)   - [facility]- [address] [place]      [phone]   - [facility] [place]- [address]      [phone]   - [facility]- [address]      [phone]   - Dr Julio Sicks-  [address], [place]      [phone]   - [facility]- [address]  [phone]   [facility]- [address]      [phone]      Also [address]      [phone]   - [facility]- [address]      [phone]      1st and 3rd Saturday every month, 10am-1pm Other agencies that provide inexpensive medical care:    [facility]  [phone]    [facility]  [phone]    [facility]  [phone]    [facility]  [phone]    [facility]  [phone]  General Information: Finding a doctor when you do not have health insurance can be tricky. Although you are not limited by an insurance plan, you are of course limited by her finances and how much but he can pay out of pocket.  What are your options if you don't have health insurance?   1) Find a Librarian, academic and Pay Out of Pocket Although you won't have to find out who is covered by your insurance plan, it is a good idea to ask around and get recommendations. You will then need to call the office and see if the doctor you have chosen will accept you as a  new patient and what types of options they offer for patients who are self-pay. Some doctors offer discounts or will set up payment plans for their patients who do not have insurance, but you will need to ask so you aren't surprised when you get to your appointment.  2) Contact Your Local Health Department Not all health departments have doctors that can see patients for sick visits, but many do, so it is worth a call to see if yours does. If you don't know where your local health department is, you can check in your phone book. The CDC also has a tool to help you locate your state's health department, and many state websites also have listings of all of their local health departments.  3) Find a Walk-in Clinic If your illness is not likely to be very severe or complicated, you may want to try a walk in clinic. These are popping up all over the country in pharmacies, drugstores, and shopping centers. They're usually staffed by nurse practitioners or physician assistants that have been trained to treat common illnesses and complaints. They're usually fairly quick and inexpensive. However, if you have serious medical issues or chronic medical problems, these are probably not your best option   RESOURCE GUIDE  Dental Problems  Patients with Medicaid: [facility] [phone] [address]                                           [phone] [address] [employer] Phone:  [phone]                                                  Phone:  [phone]  If unable to pay or uninsured, contact:  [facility] or [facility] to become qualified for the adult dental clinic.  Chronic Pain Problems  Contact [facility]  [phone] Patients need to be referred by their primary care doctor.  Insufficient Money for Medicine Contact [employer] Way:  call "211" or [facility] [phone].  No Primary Care Doctor Call [facility]   [phone] Other agencies that provide inexpensive medical care    [facility]  [phone]    [facility]  [phone]    [facility]  [phone]    [facility]  [phone]    [facility]  [phone]    [facility]  [phone]  Psychological Services [facility]  [phone] [facility] Services  [phone] [facility]   [phone] (emergency services [phone])  Substance Abuse Resources [facility] and Drug Services  [phone] [facility] [phone] [facility] [phone] Floydene Flock [phone] Residential & Outpatient Substance Abuse Program  [phone]  Abuse/Neglect [facility] Child Abuse Hotline 205-818-2290 [facility] Child Abuse Hotline [phone] (After Hours)  Emergency Shelter [facility] [phone]  Maternity Homes [facility] [place] [facility] [place] [phone] [facility] [phone]  MRSA Hotline #:   803-269-7378    [facility] Resources  [facility]     [employer]                          [facility] Dept. [address] [place]                       [address]      [address]  Blondell Reveal Phone:  [phone]                                   Phone:  [phone]                 Phone:  [phone]  [facility] Mental Health Phone:  [phone]  [facility] Child Abuse Hotline 570-183-1795 [phone] (After Hours)

## 2011-08-17 ENCOUNTER — Other Ambulatory Visit: Payer: Self-pay | Admitting: Family Medicine

## 2011-08-17 DIAGNOSIS — N6320 Unspecified lump in the left breast, unspecified quadrant: Secondary | ICD-10-CM

## 2011-08-23 ENCOUNTER — Ambulatory Visit
Admission: RE | Admit: 2011-08-23 | Discharge: 2011-08-23 | Disposition: A | Discharge status: Home / Self Care | Payer: No Typology Code available for payment source | Source: Ambulatory Visit | Attending: Family Medicine | Admitting: Family Medicine

## 2011-08-23 DIAGNOSIS — N6320 Unspecified lump in the left breast, unspecified quadrant: Secondary | ICD-10-CM

## 2012-04-04 ENCOUNTER — Encounter (HOSPITAL_COMMUNITY): Payer: Self-pay | Admitting: Emergency Medicine

## 2012-04-04 ENCOUNTER — Emergency Department (HOSPITAL_COMMUNITY)
Admission: EM | Admit: 2012-04-04 | Discharge: 2012-04-04 | Disposition: A | Discharge status: Home / Self Care | Payer: Self-pay | Attending: Emergency Medicine | Admitting: Emergency Medicine

## 2012-04-04 DIAGNOSIS — Z8742 Personal history of other diseases of the female genital tract: Secondary | ICD-10-CM | POA: Insufficient documentation

## 2012-04-04 DIAGNOSIS — K089 Disorder of teeth and supporting structures, unspecified: Secondary | ICD-10-CM | POA: Insufficient documentation

## 2012-04-04 DIAGNOSIS — K0889 Other specified disorders of teeth and supporting structures: Secondary | ICD-10-CM

## 2012-04-04 DIAGNOSIS — F17200 Nicotine dependence, unspecified, uncomplicated: Secondary | ICD-10-CM | POA: Insufficient documentation

## 2012-04-04 MED ORDER — TRAMADOL HCL 50 MG PO TABS: 50.0000 mg | ORAL_TABLET | Freq: Four times a day (QID) | ORAL | Status: DC | PRN

## 2012-04-04 MED ORDER — OXYCODONE-ACETAMINOPHEN 5-325 MG PO TABS
2.0000 | ORAL_TABLET | Freq: Once | ORAL | Status: AC
  Administered 2012-04-04: 2 via ORAL
  Filled 2012-04-04: qty 2

## 2012-04-04 MED ORDER — PENICILLIN V POTASSIUM 500 MG PO TABS: 500.0000 mg | ORAL_TABLET | Freq: Three times a day (TID) | ORAL | Status: DC

## 2012-04-04 MED ORDER — HYDROCODONE-ACETAMINOPHEN 5-325 MG PO TABS: 2.0000 | ORAL_TABLET | ORAL | Status: DC | PRN

## 2012-04-04 MED ORDER — TRAMADOL HCL 50 MG PO TABS
50.0000 mg | ORAL_TABLET | Freq: Once | ORAL | Status: DC
  Filled 2012-04-04: qty 1

## 2012-04-04 NOTE — ED Provider Notes (Signed)
History     CSN: [id number]  Arrival date & time 04/04/12  1250   First MD Initiated Contact with Patient 04/04/12 1301      Chief Complaint  Patient presents with  . Dental Pain    (Consider location/radiation/quality/duration/timing/severity/associated sxs/prior treatment) HPI Comments: The patient is a 22 year old otherwise healthy female who presents with dental pain that started gradually 3 days ago. The dental pain is severe, constant and progressively worsening. The pain is aching and located in central bottom teeth. The pain does not radiate. Eating makes the pain worse. Nothing makes the pain better. The patient has tried OTC medications for pain without relief. No associated symptoms. Patient denies headache, neck pain/stiffness, fever, NVD, edema, sore throat, throat swelling, wheezing, SOB, chest pain, abdominal pain.     Patient is a 22 y.o. female presenting with tooth pain.  Dental Pain   Past Medical History  Diagnosis Date  . Ovarian cyst 2012    Past Surgical History  Procedure Laterality Date  . Laceration repair    . No past surgeries      Family History  Problem Relation Age of Onset  . Cancer Mother   . Cancer Maternal Aunt     History  Substance Use Topics  . Smoking status: Current Every Day Smoker  . Smokeless tobacco: Not on file  . Alcohol Use: Yes     Comment: occ    OB History   Grav Para Term Preterm Abortions TAB SAB Ect Mult Living   2 2 2  0 0 0 0 0 0 2      Review of Systems  HENT: Positive for dental problem.   All other systems reviewed and are negative.    Allergies  Review of patient's allergies indicates no known allergies.  Home Medications  No current outpatient prescriptions on file.  BP 108/58  Pulse 78  Temp(Src) 98.3 F (36.8 C) (Oral)  Resp 18  SpO2 100%  Physical Exam  Nursing note and vitals reviewed. Constitutional: She is oriented to person, place, and time. She appears well-developed and  well-nourished. No distress.  HENT:  Head: Normocephalic and atraumatic.  Mouth/Throat: Oropharynx is clear and moist. No oropharyngeal exudate.  Poor dentition. Multiple decayed teeth. Central bottom teeth tender to percussion. No gingival erythema or edema noted.   Eyes: Conjunctivae and EOM are normal.  Neck: Normal range of motion.  Cardiovascular: Normal rate and regular rhythm.  Exam reveals no gallop and no friction rub.   No murmur heard. Pulmonary/Chest: Effort normal and breath sounds normal. She has no wheezes. She has no rales. She exhibits no tenderness.  Abdominal: Soft. There is no tenderness.  Musculoskeletal: Normal range of motion.  Neurological: She is alert and oriented to person, place, and time. Coordination normal.  Speech is goal-oriented. Moves limbs without ataxia.   Skin: Skin is warm and dry.  Psychiatric: She has a normal mood and affect. Her behavior is normal.    ED Course  Procedures (including critical care time)  Labs Reviewed - No data to display No results found.   1. Pain, dental       MDM  1:58 PM Patient given Tramadol for pain here. Patient will be discharged with Pen VK and tramadol for pain. Patient instructed to follow up with a dentist contracted through [facility]. Patient instructed to call within 48 hours to guarantee an appointment. Patient afebrile with stable vitals. No further evaluation needed at this time.  Emilia Beck, PA-C 04/04/12 [phone]

## 2012-04-04 NOTE — ED Notes (Signed)
Per patient, dental pain, lower bottom tooth, states "puss came out of it"-OTC meds have not helped

## 2012-04-04 NOTE — ED Notes (Signed)
Pt reports low dental pain.  Has tried OTC pain med without relief.  Pt declined to take ultram for pain as ordered.  Pt states "it doesn't do anything, it's like tylenol and it makes me throw up."  Yvonna Alanis EDPA notified

## 2012-04-04 NOTE — ED Provider Notes (Signed)
Medical screening examination/treatment/procedure(s) were performed by non-physician practitioner and as supervising physician I was immediately available for consultation/collaboration. Devoria Albe, MD, Armando Gang   Ward Givens, MD 04/04/12 [phone]

## 2012-04-04 NOTE — Progress Notes (Signed)
Pt confirms she does not have a pcp CM spoke with pt who confirms self pay [facility] resident with no pcp. CM discussed and provided written information for self pay pcps, importance of pcp for f/u care Pt appreciative of resources provided

## 2012-06-13 ENCOUNTER — Encounter (HOSPITAL_COMMUNITY): Payer: Self-pay

## 2012-06-13 ENCOUNTER — Inpatient Hospital Stay (HOSPITAL_COMMUNITY)
Admission: AD | Admit: 2012-06-13 | Discharge: 2012-06-13 | Disposition: A | Discharge status: Home / Self Care | Payer: Self-pay | Source: Ambulatory Visit | Attending: Obstetrics & Gynecology | Admitting: Obstetrics & Gynecology

## 2012-06-13 DIAGNOSIS — N760 Acute vaginitis: Secondary | ICD-10-CM | POA: Insufficient documentation

## 2012-06-13 DIAGNOSIS — B9689 Other specified bacterial agents as the cause of diseases classified elsewhere: Secondary | ICD-10-CM | POA: Insufficient documentation

## 2012-06-13 DIAGNOSIS — N63 Unspecified lump in unspecified breast: Secondary | ICD-10-CM

## 2012-06-13 DIAGNOSIS — A499 Bacterial infection, unspecified: Secondary | ICD-10-CM | POA: Insufficient documentation

## 2012-06-13 HISTORY — DX: Papillomavirus as the cause of diseases classified elsewhere: B97.7

## 2012-06-13 LAB — URINE MICROSCOPIC-ADD ON

## 2012-06-13 LAB — URINALYSIS, ROUTINE W REFLEX MICROSCOPIC
Bilirubin Urine: NEGATIVE
Nitrite: NEGATIVE
Specific Gravity, Urine: 1.02 (ref 1.005–1.030)
Urobilinogen, UA: 0.2 mg/dL (ref 0.0–1.0)

## 2012-06-13 LAB — CBC
MCH: 30.9 pg (ref 26.0–34.0)
MCHC: 33.1 g/dL (ref 30.0–36.0)
Platelets: 231 10*3/uL (ref 150–400)
RBC: 4.05 MIL/uL (ref 3.87–5.11)

## 2012-06-13 LAB — POCT PREGNANCY, URINE: Preg Test, Ur: NEGATIVE

## 2012-06-13 MED ORDER — METRONIDAZOLE 500 MG PO TABS: 500.0000 mg | ORAL_TABLET | Freq: Two times a day (BID) | ORAL | Status: DC

## 2012-06-13 NOTE — MAU Provider Note (Signed)
History     CSN: [id number]  Arrival date and time: 06/13/12 1006   First Provider Initiated Contact with Patient 06/13/12 1053      Chief Complaint  Patient presents with  . Vaginal Bleeding  . Breast Mass   HPI [name] is a 22 y.o. [phone] who presents to MAU today with complaint of breast mass and vaginal bleeding. The patient states that she had a previous breast mass that was evaluated by [place] last year. That mass resolved and this is a new mass in her upper left breast. The mass is firm and tender. It has been there x 3 weeks. It has been painful x 1 week. The patient denies fever, nipple drainage. LMP was 06/05/12. The patient is also concerned about abnormal bleeding. The patient states that she had a normal LMP about 2 weeks ago and then had had spotting on Sunday. On Monday she had heavier bleeding overnight with some small clots. She was also having cramping rated at 8/10 at the worst. She took one of her Dad's percocets yesterday and was able to sleep. She has not taken any medications today. She denies a history of irregular bleeding. She has been on Depo in 10/2011 and bled x 3 months, so she never got another injection. She is interested in the IUD now.   OB History   Grav Para Term Preterm Abortions TAB SAB Ect Mult Living   2 2 2  0 0 0 0 0 0 2      Past Medical History  Diagnosis Date  . Ovarian cyst 2012  . HPV (human papilloma virus) infection     Past Surgical History  Procedure Laterality Date  . Laceration repair    . No past surgeries      Family History  Problem Relation Age of Onset  . Cancer Mother   . Cancer Maternal Aunt     History  Substance Use Topics  . Smoking status: Current Every Day Smoker  . Smokeless tobacco: Not on file  . Alcohol Use: No     Comment: occ    Allergies: No Known Allergies  No prescriptions prior to admission    Review of Systems  Constitutional: Positive for malaise/fatigue. Negative for fever.   Gastrointestinal: Positive for abdominal pain. Negative for nausea, vomiting, diarrhea and constipation.  Genitourinary: Negative for dysuria, urgency and frequency.       + vaginal discharge + vaginal bleeding  Neurological: Positive for weakness. Negative for dizziness and loss of consciousness.   Physical Exam   Blood pressure 116/52, pulse 72, temperature 97.9 F (36.6 C), temperature source Oral, resp. rate 16, height 5\' 1"  (1.549 m), weight 113 lb (51.256 kg), last menstrual period 06/05/2012.  Physical Exam  Constitutional: She is oriented to person, place, and time. She appears well-developed and well-nourished. No distress.  HENT:  Head: Normocephalic and atraumatic.  Cardiovascular: Normal rate, regular rhythm and normal heart sounds.   Respiratory: Effort normal and breath sounds normal. No respiratory distress.  GI: Soft. Bowel sounds are normal. She exhibits no distension. There is tenderness (mild tenderness to palpation of the lower abdomen).  Genitourinary: Uterus is tender. Uterus is not enlarged. Cervix exhibits discharge (scant mucus discharge noted). Cervix exhibits no motion tenderness and no friability. Right adnexum displays tenderness. Right adnexum displays no mass. Left adnexum displays tenderness. Left adnexum displays no mass. No bleeding around the vagina. Vaginal discharge (scant mucus discharge noted) found.  Neurological: She is alert  and oriented to person, place, and time.  Skin: Skin is warm and dry. No erythema.  Psychiatric: She has a normal mood and affect.   Results for orders placed during the hospital encounter of 06/13/12 (from the past 24 hour(s))  URINALYSIS, ROUTINE W REFLEX MICROSCOPIC     Status: Abnormal   Collection Time    06/13/12 10:20 AM      Result Value Range   Color, Urine YELLOW  YELLOW   APPearance CLEAR  CLEAR   Specific Gravity, Urine 1.020  1.005 - 1.030   pH 6.0  5.0 - 8.0   Glucose, UA NEGATIVE  NEGATIVE mg/dL   Hgb  urine dipstick SMALL (*) NEGATIVE   Bilirubin Urine NEGATIVE  NEGATIVE   Ketones, ur NEGATIVE  NEGATIVE mg/dL   Protein, ur NEGATIVE  NEGATIVE mg/dL   Urobilinogen, UA 0.2  0.0 - 1.0 mg/dL   Nitrite NEGATIVE  NEGATIVE   Leukocytes, UA NEGATIVE  NEGATIVE  URINE MICROSCOPIC-ADD ON     Status: None   Collection Time    06/13/12 10:20 AM      Result Value Range   Squamous Epithelial / LPF RARE  RARE   WBC, UA 0-2  <3 WBC/hpf   RBC / HPF 0-2  <3 RBC/hpf   Urine-Other MUCOUS PRESENT    POCT PREGNANCY, URINE     Status: None   Collection Time    06/13/12 10:29 AM      Result Value Range   Preg Test, Ur NEGATIVE  NEGATIVE  CBC     Status: None   Collection Time    06/13/12 10:35 AM      Result Value Range   WBC 8.5  4.0 - 10.5 K/uL   RBC 4.05  3.87 - 5.11 MIL/uL   Hemoglobin 12.5  12.0 - 15.0 g/dL   HCT 16.1  09.6 - 04.5 %   MCV 93.3  78.0 - 100.0 fL   MCH 30.9  26.0 - 34.0 pg   MCHC 33.1  30.0 - 36.0 g/dL   RDW 40.9  81.1 - 91.4 %   Platelets 231  150 - 400 K/uL  WET PREP, GENITAL     Status: Abnormal   Collection Time    06/13/12 10:58 AM      Result Value Range   Yeast Wet Prep HPF POC NONE SEEN  NONE SEEN   Trich, Wet Prep NONE SEEN  NONE SEEN   Clue Cells Wet Prep HPF POC FEW (*) NONE SEEN   WBC, Wet Prep HPF POC MODERATE (*) NONE SEEN    MAU Course  Procedures None  MDM Wet prep, GC/Chlamydia today Mirena assistance paperwork given BCCCP referral sent Hgb - normal  No bleeding on exam Assessment and Plan  A: Bacterial vaginosis Breast lump with tenderness  P: Discharge home Rx for Flagyl sent to patient's pharmacy Discussed hygiene products and probiotics for avoiding bacterial vaginosis Referral [facility] to [facility] for follow-up Patient given [facility] foundation today Patient may return to MAU as needed or if her condition were to change or worsen  Freddi Starr, PA-C  06/13/2012, 3:21 PM

## 2012-06-13 NOTE — MAU Provider Note (Signed)
Attestation of Attending Supervision of Advanced Practitioner (PA/CNM/NP): Evaluation and management procedures were performed by the Advanced Practitioner under my supervision and collaboration.  I have reviewed the Advanced Practitioner's note and chart, and I agree with the management and plan.  Keyshia Orwick, MD, FACOG Attending Obstetrician & Gynecologist Faculty Practice, [facility]  

## 2012-06-13 NOTE — MAU Note (Signed)
Hx of lump in left breast, had it checked out and later it went away.  About 3 wks ago, noted lump had returned and is painful.  IS on cycle, heavy with clots, never had a cycle like this before.  lmp was actually 2 wks ago, started spotting on Sunday and became heavy yesterday.

## 2012-06-15 ENCOUNTER — Encounter (HOSPITAL_COMMUNITY): Payer: Self-pay | Admitting: *Deleted

## 2012-09-06 ENCOUNTER — Encounter (HOSPITAL_COMMUNITY): Payer: Self-pay | Admitting: *Deleted

## 2012-09-06 ENCOUNTER — Emergency Department (HOSPITAL_COMMUNITY)
Admission: EM | Admit: 2012-09-06 | Discharge: 2012-09-06 | Disposition: A | Discharge status: Home / Self Care | Payer: Self-pay | Attending: Emergency Medicine | Admitting: Emergency Medicine

## 2012-09-06 DIAGNOSIS — Z8742 Personal history of other diseases of the female genital tract: Secondary | ICD-10-CM | POA: Insufficient documentation

## 2012-09-06 DIAGNOSIS — R51 Headache: Secondary | ICD-10-CM | POA: Insufficient documentation

## 2012-09-06 DIAGNOSIS — Z8619 Personal history of other infectious and parasitic diseases: Secondary | ICD-10-CM | POA: Insufficient documentation

## 2012-09-06 DIAGNOSIS — R599 Enlarged lymph nodes, unspecified: Secondary | ICD-10-CM | POA: Insufficient documentation

## 2012-09-06 DIAGNOSIS — R197 Diarrhea, unspecified: Secondary | ICD-10-CM | POA: Insufficient documentation

## 2012-09-06 DIAGNOSIS — R591 Generalized enlarged lymph nodes: Secondary | ICD-10-CM

## 2012-09-06 DIAGNOSIS — F17200 Nicotine dependence, unspecified, uncomplicated: Secondary | ICD-10-CM | POA: Insufficient documentation

## 2012-09-06 DIAGNOSIS — J029 Acute pharyngitis, unspecified: Secondary | ICD-10-CM | POA: Insufficient documentation

## 2012-09-06 DIAGNOSIS — B029 Zoster without complications: Secondary | ICD-10-CM | POA: Insufficient documentation

## 2012-09-06 DIAGNOSIS — M549 Dorsalgia, unspecified: Secondary | ICD-10-CM | POA: Insufficient documentation

## 2012-09-06 DIAGNOSIS — Z79899 Other long term (current) drug therapy: Secondary | ICD-10-CM | POA: Insufficient documentation

## 2012-09-06 MED ORDER — ACYCLOVIR 400 MG PO TABS: 800.0000 mg | ORAL_TABLET | Freq: Every day | ORAL | Status: DC

## 2012-09-06 MED ORDER — OXYCODONE-ACETAMINOPHEN 5-325 MG PO TABS: 1.0000 | ORAL_TABLET | Freq: Three times a day (TID) | ORAL | Status: DC | PRN

## 2012-09-06 MED ORDER — ACYCLOVIR 200 MG PO CAPS
800.0000 mg | ORAL_CAPSULE | Freq: Once | ORAL | Status: AC
  Administered 2012-09-06: 800 mg via ORAL
  Filled 2012-09-06: qty 4

## 2012-09-06 NOTE — ED Provider Notes (Signed)
CSN: [id number]     Arrival date & time 09/06/12  4098 History    This chart was scribed for Donna Mutton, PA working with [name], MD by [name], ED Scribe. This patient was seen in room WTR8/WTR8 and the patient's care was started at 8:25 PM.     Chief Complaint  Patient presents with  . Rash    The history is provided by the patient. No language interpreter was used.    HPI Comments: [name] is a 22 y.o. female who presents to the Emergency Department complaining of a gradually-worsening, itchy, painful rash to her right lower back that began 1 week ago and has begun to spread to her right side and right thigh.  Rash is itchy and she states that scratching the rash causes a burning pain.  She also notes an aching sensation around the rash that extends in a dermatomal distribution.  She has attempted to treat symptoms with calamine lotion, without relief.  Pt also notes that 3 days ago she had a sore throat which resolved.  She also states that yesterday she developed a headache and diarrhea.  She has not had these symptoms today.  She denies discharge from the rash, fever, cough, congestion, nausea, emesis, abdominal pain, visual disturbances, neck pain, neck stiffness, or back pain.  Pt has h/o varicella as a child.  She denies recent travel or insect bites and states she has not spent time in woods or wilderness areas recently.  Pt also complains of a painful knot to her right inguinal area that has been present for 3 days.  Pain is greatly exacerbated by pressing on the area.  Pt has no PCP Pt has no OB/GYN  Past Medical History  Diagnosis Date  . Ovarian cyst 2012  . HPV (human papilloma virus) infection     Past Surgical History  Procedure Laterality Date  . Laceration repair    . No past surgeries      Family History  Problem Relation Age of Onset  . Cancer Mother   . Cancer Maternal Aunt     History  Substance Use Topics  . Smoking status:  Current Every Day Smoker  . Smokeless tobacco: Not on file  . Alcohol Use: No     Comment: occ    OB History   Grav Para Term Preterm Abortions TAB SAB Ect Mult Living   2 2 2  0 0 0 0 0 0 2       Review of Systems  Constitutional: Negative for fever.  HENT: Positive for sore throat. Negative for congestion, neck pain and neck stiffness.   Eyes: Negative for visual disturbance.  Respiratory: Negative for cough.   Gastrointestinal: Positive for diarrhea. Negative for nausea, vomiting and abdominal pain.  Musculoskeletal: Positive for back pain.       Knot in right inguinal area  Skin: Positive for rash.  Neurological: Positive for headaches.  All other systems reviewed and are negative.      Allergies  Review of patient's allergies indicates no known allergies.  Home Medications   Current Outpatient Rx  Name  Route  Sig  Dispense  Refill  . acetaminophen (TYLENOL) 325 MG tablet   Oral   Take 650 mg by mouth every 6 (six) hours as needed for pain.         Marland Kitchen acyclovir (ZOVIRAX) 400 MG tablet   Oral   Take 2 tablets (800 mg total) by mouth 5 (five)  times daily.   100 tablet   0   . diphenhydrAMINE-zinc acetate (BENADRYL) cream   Topical   Apply 1 application topically 3 (three) times daily as needed for itching.         . metroNIDAZOLE (FLAGYL) 500 MG tablet   Oral   Take 1 tablet (500 mg total) by mouth 2 (two) times daily.   14 tablet   0   . oxyCODONE-acetaminophen (PERCOCET) 5-325 MG per tablet   Oral   Take 1 tablet by mouth every 8 (eight) hours as needed for pain.   11 tablet   0    BP 112/67  Pulse 85  Temp(Src) 98.5 F (36.9 C) (Oral)  Resp 16  Ht 5\' 1"  (1.549 m)  Wt 120 lb (54.432 kg)  BMI 22.69 kg/m2  SpO2 100%  LMP 09/05/2012  Physical Exam  Nursing note and vitals reviewed. Constitutional: She is oriented to person, place, and time. She appears well-developed and well-nourished. No distress.  HENT:  Head: Normocephalic and  atraumatic.  Mouth/Throat: Oropharynx is clear and moist. No oropharyngeal exudate.  Eyes: Conjunctivae are normal. Pupils are equal, round, and reactive to light. Right eye exhibits no discharge.  Neck: Normal range of motion. Neck supple. No tracheal deviation present.  Cardiovascular: Normal rate, regular rhythm, normal heart sounds and intact distal pulses.   No murmur heard. Pulmonary/Chest: Effort normal and breath sounds normal. No respiratory distress. She has no wheezes. She has no rales.  Musculoskeletal: Normal range of motion.       Back:  Neurological: She is alert and oriented to person, place, and time. She exhibits normal muscle tone. Coordination normal.  Skin: Skin is warm and dry. Rash noted.  Patch of clear-fluid filled vesicles with erythematous base located to the lumbosacral region on the right side. Small lesions with erythematous base to the anterior aspect of the right upper leg. Pain upon palpation. Unilateral rash following a dermatomal pattern.   Mild lymphadenopathy to the right inguinal region - soft, mobile, tender upon palpation.   Psychiatric: She has a normal mood and affect. Her behavior is normal.    ED Course  Procedures (including critical care time)  DIAGNOSTIC STUDIES: Oxygen Saturation is 100% on room air, normal by my interpretation.    COORDINATION OF CARE: 8:35 PM-Informed pt that symptoms are likely due to shingles. Discussed treatment plan which includes acyclovir and return to ED next week for f/u.  Advised strict return precautions.  Pt expressed understanding and agreed to plan.    Labs Reviewed - No data to display  No results found.  1. Shingles   2. Lymphadenopathy      MDM  I personally performed the services described in this documentation, which was scribed in my presence. The recorded information has been reviewed and is accurate.  Patient presenting to the ED with rash that started approximately one week ago - patient  reported that the rash is painful and on one side of her body, predominantly to the right side. Patch of clear fluid-filled vesicles with erythematous base located to the right lower aspect of the back - following a dermatomal pattern - unilateral in nature, predominantly to the right side of the body. Pain with palpation. Mild swelling and lymphadenopathy noted to the right inguinal region - soft, mobile, tender upon palpation - suspicion to be due to viral infection of shingles patient is currently experiencing, doubt any form of cancerous source since lymphadenopathy began a week ago, acute  in nature, and soft and mobile. Patient stable, afebrile. Suspicion to be shingles - stated that she had chickenpox in the past. Discharged patient with Acyclovir. Educated patient on how contagious shingles can be and to stay clear of young children and pregnant. Discussed with patient hygiene and cleanliness - discussed with patient on how to care for the rash and to keep it covered. discussed with patient to rest and stay hydrated. Discussed with patient to return to the ED next week to be re-evaluated. Discharged patient with small dose of pain medications - discussed course, precautions, disposal technique. Discussed with patient to continue to monitor symptoms and if symptoms are to worsen or change to report back to the ED - strict return instructions given.  Patient agreed to plan of care, understood, all questions answered.   Donna Mutton, PA-C 09/06/12 2231

## 2012-09-06 NOTE — ED Provider Notes (Signed)
Medical screening examination/treatment/procedure(s) were performed by non-physician practitioner and as supervising physician I was immediately available for consultation/collaboration.   Domenic Schoenberger, MD 09/06/12 2332 

## 2012-09-06 NOTE — ED Notes (Signed)
Pt reports raised red clustered rash on R lower back x 1 week.  Pt reports pain and itchiness.  Pt also reports noticing a painful  "knot" in her R pelvic area x 3 days.  Does not appear to be an abscess.

## 2012-09-28 ENCOUNTER — Emergency Department (HOSPITAL_COMMUNITY): Admission: EM | Admit: 2012-09-28 | Discharge: 2012-09-28 | Discharge status: Against Medical Advice | Payer: Self-pay

## 2012-09-28 NOTE — ED Notes (Signed)
Called for pt; not in waiting area

## 2012-11-15 ENCOUNTER — Encounter (HOSPITAL_COMMUNITY): Payer: Self-pay | Admitting: Emergency Medicine

## 2012-11-15 ENCOUNTER — Emergency Department (HOSPITAL_COMMUNITY)
Admission: EM | Admit: 2012-11-15 | Discharge: 2012-11-15 | Disposition: A | Discharge status: Home / Self Care | Payer: Self-pay | Attending: Emergency Medicine | Admitting: Emergency Medicine

## 2012-11-15 DIAGNOSIS — L0390 Cellulitis, unspecified: Secondary | ICD-10-CM

## 2012-11-15 DIAGNOSIS — L03211 Cellulitis of face: Secondary | ICD-10-CM | POA: Insufficient documentation

## 2012-11-15 DIAGNOSIS — F17200 Nicotine dependence, unspecified, uncomplicated: Secondary | ICD-10-CM | POA: Insufficient documentation

## 2012-11-15 DIAGNOSIS — H9209 Otalgia, unspecified ear: Secondary | ICD-10-CM | POA: Insufficient documentation

## 2012-11-15 DIAGNOSIS — L0201 Cutaneous abscess of face: Secondary | ICD-10-CM | POA: Insufficient documentation

## 2012-11-15 DIAGNOSIS — H6001 Abscess of right external ear: Secondary | ICD-10-CM

## 2012-11-15 HISTORY — DX: Zoster without complications: B02.9

## 2012-11-15 MED ORDER — LIDOCAINE HCL 2 % IJ SOLN
5.0000 mL | Freq: Once | INTRAMUSCULAR | Status: AC
  Administered 2012-11-15: 11:00:00
  Filled 2012-11-15: qty 20

## 2012-11-15 MED ORDER — SULFAMETHOXAZOLE-TRIMETHOPRIM 800-160 MG PO TABS: 1.0000 | ORAL_TABLET | Freq: Two times a day (BID) | ORAL | Status: DC

## 2012-11-15 MED ORDER — HYDROCODONE-ACETAMINOPHEN 5-325 MG PO TABS
1.0000 | ORAL_TABLET | Freq: Once | ORAL | Status: AC
  Administered 2012-11-15: 1 via ORAL
  Filled 2012-11-15: qty 1

## 2012-11-15 MED ORDER — LIDOCAINE-EPINEPHRINE-TETRACAINE (LET) SOLUTION
3.0000 mL | Freq: Once | NASAL | Status: AC
  Administered 2012-11-15: 3 mL via TOPICAL
  Filled 2012-11-15: qty 3

## 2012-11-15 MED ORDER — CEPHALEXIN 500 MG PO CAPS: 500.0000 mg | ORAL_CAPSULE | Freq: Four times a day (QID) | ORAL | Status: DC

## 2012-11-15 NOTE — Progress Notes (Signed)
[facility] provided pt with a [facility] [employer] Card application and a list of primary care resources.  °

## 2012-11-15 NOTE — ED Provider Notes (Addendum)
CSN: [id number]     Arrival date & time 11/15/12  0903 History   First MD Initiated Contact with Patient 11/15/12 [phone]     Chief Complaint  Patient presents with  . Otalgia  . ear cyst    (Consider location/radiation/quality/duration/timing/severity/associated sxs/prior Treatment) HPI Comments: 22 year old Caucasian female presents today with cc of nodule. Began as small "pimple" one month ago, behind her right eat. Patient has been "picking at" bump. Over past week, bump has grown larger and become increasingly painful (8/10).  History of left post auricular abscess drainage 4 yrs ago. Denies fever, chills, internal ear pain, and drainage.   Patient is a 22 y.o. female presenting with ear pain. The history is provided by the patient.  Otalgia Associated symptoms: no abdominal pain, no ear discharge, no headaches, no neck pain and no vomiting     Past Medical History  Diagnosis Date  . Ovarian cyst 2012  . HPV (human papilloma virus) infection   . Shingles    Past Surgical History  Procedure Laterality Date  . Laceration repair    . No past surgeries     Family History  Problem Relation Age of Onset  . Cancer Mother   . Cancer Maternal Aunt    History  Substance Use Topics  . Smoking status: Current Every Day Smoker    Types: Cigarettes  . Smokeless tobacco: Not on file  . Alcohol Use: No     Comment: occ   OB History   Grav Para Term Preterm Abortions TAB SAB Ect Mult Living   2 2 2  0 0 0 0 0 0 2     Review of Systems  Constitutional: Negative for activity change.  HENT: Positive for ear pain. Negative for ear discharge.   Respiratory: Negative for shortness of breath.   Cardiovascular: Negative for chest pain.  Gastrointestinal: Negative for nausea, vomiting and abdominal pain.  Genitourinary: Negative for dysuria.  Musculoskeletal: Negative for neck pain.  Neurological: Negative for headaches.    Allergies  Review of patient's allergies indicates no known  allergies.  Home Medications   Current Outpatient Rx  Name  Route  Sig  Dispense  Refill  . ibuprofen (ADVIL,MOTRIN) 200 MG tablet   Oral   Take 200 mg by mouth every 6 (six) hours as needed for pain.          BP 121/68  Pulse 67  Temp(Src) 98.7 F (37.1 C) (Oral)  Resp 20  SpO2 100%  LMP 10/30/2012 Physical Exam  Nursing note and vitals reviewed. Constitutional: She appears well-developed.  HENT:  Head: Normocephalic.  On the posterior part of the ear patient has a 1 cm area lesion, erythematous, tender to palpation with some fluctuance. No post auricular lymphadenopathy.   Eyes: Conjunctivae are normal.  Neck: Neck supple.  Cardiovascular: Normal rate.   Pulmonary/Chest: Effort normal.    ED Course  Procedures (including critical care time) Labs Review Labs Reviewed - No data to display Imaging Review No results found.  MDM  No diagnosis found.  Pt comes in with cc of ear pain, nodule. Appears to have an abscess. The site is not where the posterior auricular lymphnode sit. Could be cyst, but states that she has had abscesses before. Mild fluctuance, discussed conservative approach with AB and heating pads vs AB and i&d discussed, and she prefers the latter - even with me stating that conservative tx might be better, and will allow for her body to naturally  rid the infection.  I&d and antibiotics.   Derwood Kaplan, MD 11/15/12 1137  INCISION AND DRAINAGE Performed by: Derwood Kaplan Consent: Verbal consent obtained. Risks and benefits: risks, benefits and alternatives were discussed Type: abscess  Body area: right ear  Anesthesia: local infiltration  Incision was made with a scalpel.  Local anesthetic: lidocaine 2 % w/o epinephrine  Anesthetic total: 1 ml  Complexity: complex Blunt dissection to break up loculations  Drainage: purulent  Drainage amount: 1 cc, thick purulent discharge  Packing material: NONE  Patient tolerance: Patient  tolerated the procedure well with no immediate complications.    Derwood Kaplan, MD 11/15/12 1213  Derwood Kaplan, MD 11/15/12 1213

## 2012-11-15 NOTE — ED Notes (Signed)
Pt c/o R ear pain and a "knot" behind R ear x 1 month.  Pain score 8/10.  Pt sts she has had these "knots" before and they had to "cut them open."  Denies drainage.

## 2013-01-31 ENCOUNTER — Encounter (HOSPITAL_COMMUNITY): Payer: Self-pay | Admitting: Emergency Medicine

## 2013-01-31 ENCOUNTER — Emergency Department (HOSPITAL_COMMUNITY)
Admission: EM | Admit: 2013-01-31 | Discharge: 2013-01-31 | Disposition: A | Discharge status: Home / Self Care | Payer: Self-pay | Attending: Emergency Medicine | Admitting: Emergency Medicine

## 2013-01-31 DIAGNOSIS — S61409A Unspecified open wound of unspecified hand, initial encounter: Secondary | ICD-10-CM | POA: Insufficient documentation

## 2013-01-31 DIAGNOSIS — Z792 Long term (current) use of antibiotics: Secondary | ICD-10-CM | POA: Insufficient documentation

## 2013-01-31 DIAGNOSIS — W260XXA Contact with knife, initial encounter: Secondary | ICD-10-CM | POA: Insufficient documentation

## 2013-01-31 DIAGNOSIS — Y929 Unspecified place or not applicable: Secondary | ICD-10-CM | POA: Insufficient documentation

## 2013-01-31 DIAGNOSIS — Z8742 Personal history of other diseases of the female genital tract: Secondary | ICD-10-CM | POA: Insufficient documentation

## 2013-01-31 DIAGNOSIS — Z8619 Personal history of other infectious and parasitic diseases: Secondary | ICD-10-CM | POA: Insufficient documentation

## 2013-01-31 DIAGNOSIS — Y9389 Activity, other specified: Secondary | ICD-10-CM | POA: Insufficient documentation

## 2013-01-31 DIAGNOSIS — F17200 Nicotine dependence, unspecified, uncomplicated: Secondary | ICD-10-CM | POA: Insufficient documentation

## 2013-01-31 DIAGNOSIS — S61412A Laceration without foreign body of left hand, initial encounter: Secondary | ICD-10-CM

## 2013-01-31 NOTE — ED Provider Notes (Signed)
CSN: [id number]     Arrival date & time 01/31/13  2113 History  This chart was scribed for non-physician practitioner, Junious Silk, PA-C,working with Toy Baker, MD, by Karle Plumber, ED Scribe.  This patient was seen in room WTR8/WTR8 and the patient's care was started at 9:23 PM.  Chief Complaint  Patient presents with  . Laceration   The history is provided by the patient. No language interpreter was used.   HPI Comments:  [name] is a 22 y.o. female who presents to the Emergency Department complaining of a left hand laceration. She reports associated severe stinging pain. She states she was opening a Christmas present using a pocket knife and sliced into her hand. She reports associated bleeding that has now stopped. Pt states she is right-hand dominant. She states her tetanus vaccination is UTD.   Past Medical History  Diagnosis Date  . Ovarian cyst 2012  . HPV (human papilloma virus) infection   . Shingles    Past Surgical History  Procedure Laterality Date  . Laceration repair    . No past surgeries     Family History  Problem Relation Age of Onset  . Cancer Mother   . Cancer Maternal Aunt    History  Substance Use Topics  . Smoking status: Current Every Day Smoker    Types: Cigarettes  . Smokeless tobacco: Not on file  . Alcohol Use: No     Comment: occ   OB History   Grav Para Term Preterm Abortions TAB SAB Ect Mult Living   2 2 2  0 0 0 0 0 0 2     Review of Systems  Skin: Positive for wound.  All other systems reviewed and are negative.    Allergies  Review of patient's allergies indicates no known allergies.  Home Medications   Current Outpatient Rx  Name  Route  Sig  Dispense  Refill  . cephALEXin (KEFLEX) 500 MG capsule   Oral   Take 1 capsule (500 mg total) by mouth 4 (four) times daily.   28 capsule   0   . ibuprofen (ADVIL,MOTRIN) 200 MG tablet   Oral   Take 200 mg by mouth every 6 (six) hours as needed for pain.         Marland Kitchen sulfamethoxazole-trimethoprim (SEPTRA DS) 800-160 MG per tablet   Oral   Take 1 tablet by mouth 2 (two) times daily.   14 tablet   0    Triage Vitals: BP 110/61  Pulse 102  Temp(Src) 98.4 F (36.9 C) (Oral)  Resp 16  SpO2 100%  LMP 01/24/2013 Physical Exam  Nursing note and vitals reviewed. Constitutional: She is oriented to person, place, and time. She appears well-developed and well-nourished. No distress.  HENT:  Head: Normocephalic and atraumatic.  Right Ear: External ear normal.  Left Ear: External ear normal.  Nose: Nose normal.  Mouth/Throat: Oropharynx is clear and moist.  Eyes: Conjunctivae are normal.  Neck: Normal range of motion.  Cardiovascular: Normal rate, regular rhythm and normal heart sounds.   Cap refill less than 3 seconds.  Pulmonary/Chest: Effort normal and breath sounds normal. No stridor. No respiratory distress. She has no wheezes. She has no rales.  Abdominal: Soft. She exhibits no distension.  Musculoskeletal: Normal range of motion.  Neurological: She is alert and oriented to person, place, and time. She has normal strength.  Skin: Skin is warm and dry. She is not diaphoretic. No erythema.  1.5 cm superficial  laceration to lateral palmar aspect of left hand. Bleeding controlled. Bottom of wound visualized, no tendon involvement or foreign bodies.   Psychiatric: She has a normal mood and affect. Her behavior is normal.    ED Course  Procedures (including critical care time) DIAGNOSTIC STUDIES: Oxygen Saturation is 100% on RA, normal by my interpretation.   COORDINATION OF CARE: 9:26 PM- Will suture laceration. Advised pt to return to ED if drainage or fever occur. Pt verbalizes understanding and agrees to plan.  LACERATION REPAIR PROCEDURE NOTE The patient's identification was confirmed and consent was obtained. This procedure was performed by Junious Silk, PA-C at 9:27 PM. Site: left hand  Sterile procedures observed Anesthetic used  (type and amt): Lidocaine 1% without Epinephrine 2 mL Suture type/size:4-0 Ethilon Length: 1.5 cm # of Sutures: 2 Technique: simple, interrupted Complexity: simple Antibx ointment applied Tetanus UTD  Site anesthetized, irrigated with NS, explored without evidence of foreign body, wound well approximated, site covered with dry, sterile dressing.  Patient tolerated procedure well without complications. Instructions for care discussed verbally and patient provided with additional written instructions for homecare and f/u.   Medications - No data to display  Labs Review Labs Reviewed - No data to display Imaging Review No results found.  EKG Interpretation   None       MDM   1. Hand laceration, left, initial encounter    Tdap booster UTD. Wound cleaning complete with pressure irrigation, bottom of wound visualized, no foreign bodies appreciated. Laceration occurred < 8 hours prior to repair which was well tolerated. Pt has no co morbidities to effect normal wound healing. Discussed suture home care w pt and answered questions. Pt to f-u for wound check and suture removal in 7 days. Pt is hemodynamically stable w no complaints prior to dc.    I personally performed the services described in this documentation, which was scribed in my presence. The recorded information has been reviewed and is accurate.     Mora Bellman, PA-C 01/31/13 2141

## 2013-01-31 NOTE — ED Provider Notes (Signed)
Medical screening examination/treatment/procedure(s) were performed by non-physician practitioner and as supervising physician I was immediately available for consultation/collaboration.  EKG Interpretation   None        Toy Baker, MD 01/31/13 2352

## 2013-01-31 NOTE — ED Notes (Signed)
Patient states that she was opening a gift and cut her hand

## 2013-04-02 ENCOUNTER — Encounter (HOSPITAL_BASED_OUTPATIENT_CLINIC_OR_DEPARTMENT_OTHER): Payer: Self-pay | Admitting: Emergency Medicine

## 2013-04-02 ENCOUNTER — Emergency Department (HOSPITAL_BASED_OUTPATIENT_CLINIC_OR_DEPARTMENT_OTHER)
Admission: EM | Admit: 2013-04-02 | Discharge: 2013-04-02 | Disposition: A | Discharge status: Home / Self Care | Payer: Self-pay | Attending: Emergency Medicine | Admitting: Emergency Medicine

## 2013-04-02 DIAGNOSIS — R21 Rash and other nonspecific skin eruption: Secondary | ICD-10-CM | POA: Insufficient documentation

## 2013-04-02 DIAGNOSIS — Z8619 Personal history of other infectious and parasitic diseases: Secondary | ICD-10-CM | POA: Insufficient documentation

## 2013-04-02 DIAGNOSIS — Z8742 Personal history of other diseases of the female genital tract: Secondary | ICD-10-CM | POA: Insufficient documentation

## 2013-04-02 DIAGNOSIS — F17200 Nicotine dependence, unspecified, uncomplicated: Secondary | ICD-10-CM | POA: Insufficient documentation

## 2013-04-02 MED ORDER — PREDNISONE 10 MG PO TABS: ORAL_TABLET | ORAL | Status: DC

## 2013-04-02 MED ORDER — METHYLPREDNISOLONE SODIUM SUCC 125 MG IJ SOLR
INTRAMUSCULAR | Status: AC
  Filled 2013-04-02: qty 2

## 2013-04-02 MED ORDER — METHYLPREDNISOLONE SODIUM SUCC 125 MG IJ SOLR
125.0000 mg | Freq: Once | INTRAMUSCULAR | Status: AC
  Administered 2013-04-02: 125 mg via INTRAMUSCULAR

## 2013-04-02 MED ORDER — HYDROXYZINE HCL 25 MG PO TABS: 25.0000 mg | ORAL_TABLET | Freq: Four times a day (QID) | ORAL | Status: DC

## 2013-04-02 MED ORDER — HYDROCODONE-ACETAMINOPHEN 5-325 MG PO TABS
2.0000 | ORAL_TABLET | ORAL | Status: DC | PRN
Start: 2013-04-02 — End: 2013-05-07

## 2013-04-02 MED ORDER — HYDROCODONE-ACETAMINOPHEN 5-325 MG PO TABS
ORAL_TABLET | ORAL | Status: AC
  Filled 2013-04-02: qty 1

## 2013-04-02 MED ORDER — METHYLPREDNISOLONE SODIUM SUCC 125 MG IJ SOLR
125.0000 mg | Freq: Once | INTRAMUSCULAR | Status: DC
  Filled 2013-04-02: qty 2

## 2013-04-02 MED ORDER — HYDROCODONE-ACETAMINOPHEN 5-325 MG PO TABS
1.0000 | ORAL_TABLET | Freq: Once | ORAL | Status: AC
  Administered 2013-04-02: 1 via ORAL

## 2013-04-02 NOTE — Discharge Instructions (Signed)

## 2013-04-02 NOTE — ED Notes (Signed)
Pt c/o rash to left side of face x 3 days

## 2013-04-03 NOTE — ED Provider Notes (Signed)
CSN: [id number]     Arrival date & time 04/02/13  1942 History   First MD Initiated Contact with Patient 04/02/13 2216     Chief Complaint  Patient presents with  . Rash     (Consider location/radiation/quality/duration/timing/severity/associated sxs/prior Treatment) Patient is a 23 y.o. female presenting with rash. The history is provided by the patient. No language interpreter was used.  Rash Location:  Face Quality: itchiness and redness   Severity:  Moderate Onset quality:  Gradual Timing:  Constant Progression:  Worsening Chronicity:  New Relieved by:  Nothing Worsened by:  Nothing tried Pt thinks she has shingles.  Pt has had on side in the past.  Pt copmplains of a painful itchy rash neck and face  Past Medical History  Diagnosis Date  . Ovarian cyst 2012  . HPV (human papilloma virus) infection   . Shingles    Past Surgical History  Procedure Laterality Date  . Laceration repair    . No past surgeries     Family History  Problem Relation Age of Onset  . Cancer Mother   . Cancer Maternal Aunt    History  Substance Use Topics  . Smoking status: Current Every Day Smoker    Types: Cigarettes  . Smokeless tobacco: Not on file  . Alcohol Use: Yes     Comment: occ   OB History   Grav Para Term Preterm Abortions TAB SAB Ect Mult Living   2 2 2  0 0 0 0 0 0 2     Review of Systems  Skin: Positive for rash.  All other systems reviewed and are negative.      Allergies  Review of patient's allergies indicates no known allergies.  Home Medications   Current Outpatient Rx  Name  Route  Sig  Dispense  Refill  . HYDROcodone-acetaminophen (NORCO/VICODIN) 5-325 MG per tablet   Oral   Take 2 tablets by mouth every 4 (four) hours as needed.   10 tablet   0   . hydrOXYzine (ATARAX/VISTARIL) 25 MG tablet   Oral   Take 1 tablet (25 mg total) by mouth every 6 (six) hours.   12 tablet   0   . predniSONE (DELTASONE) 10 MG tablet      5,4,3,2,1 taper   15  tablet   0    BP 107/67  Pulse 86  Temp(Src) 98.5 F (36.9 C) (Oral)  Resp 16  Ht 5\' 1"  (1.549 m)  Wt 111 lb (50.349 kg)  BMI 20.98 kg/m2  SpO2 100%  LMP 03/17/2013 Physical Exam  Nursing note and vitals reviewed. Constitutional: She is oriented to person, place, and time. She appears well-developed and well-nourished.  HENT:  Head: Normocephalic and atraumatic.  Mouth/Throat: Oropharynx is clear and moist.  Eyes: EOM are normal.  Neck: Normal range of motion.  Cardiovascular: Normal rate and normal heart sounds.   Pulmonary/Chest: Effort normal.  Abdominal: She exhibits no distension.  Musculoskeletal: Normal range of motion.  Neurological: She is alert and oriented to person, place, and time.  Skin: Rash noted.  Red slightly raised macular papular rash bilat anterior neck and face  Psychiatric: She has a normal mood and affect.    ED Course  Procedures (including critical care time) Labs Review Labs Reviewed - No data to display Imaging Review No results found.  EKG Interpretation   None       MDM   Final diagnoses:  Rash    Pt given IM solumedrol  Rx for atarax, prednisone and hydrocodone    Elson AreasLeslie K Janaki Exley, [place] JerseyPA-C 04/03/13 1452

## 2013-04-04 NOTE — ED Provider Notes (Signed)
Medical screening examination/treatment/procedure(s) were performed by non-physician practitioner and as supervising physician I was immediately available for consultation/collaboration.  EKG Interpretation   None         Shelda JakesScott W. Bryanna Yim, MD 04/04/13 [id number]

## 2013-05-07 ENCOUNTER — Encounter (HOSPITAL_COMMUNITY): Payer: Self-pay | Admitting: Emergency Medicine

## 2013-05-07 ENCOUNTER — Emergency Department (HOSPITAL_COMMUNITY)
Admission: EM | Admit: 2013-05-07 | Discharge: 2013-05-07 | Disposition: A | Discharge status: Home / Self Care | Payer: Self-pay | Attending: Emergency Medicine | Admitting: Emergency Medicine

## 2013-05-07 DIAGNOSIS — Z8619 Personal history of other infectious and parasitic diseases: Secondary | ICD-10-CM | POA: Insufficient documentation

## 2013-05-07 DIAGNOSIS — R112 Nausea with vomiting, unspecified: Secondary | ICD-10-CM | POA: Insufficient documentation

## 2013-05-07 DIAGNOSIS — H6692 Otitis media, unspecified, left ear: Secondary | ICD-10-CM

## 2013-05-07 DIAGNOSIS — H6690 Otitis media, unspecified, unspecified ear: Secondary | ICD-10-CM | POA: Insufficient documentation

## 2013-05-07 DIAGNOSIS — K029 Dental caries, unspecified: Secondary | ICD-10-CM | POA: Insufficient documentation

## 2013-05-07 DIAGNOSIS — F17200 Nicotine dependence, unspecified, uncomplicated: Secondary | ICD-10-CM | POA: Insufficient documentation

## 2013-05-07 DIAGNOSIS — Z8742 Personal history of other diseases of the female genital tract: Secondary | ICD-10-CM | POA: Insufficient documentation

## 2013-05-07 MED ORDER — AMOXICILLIN-POT CLAVULANATE 875-125 MG PO TABS: 1.0000 | ORAL_TABLET | Freq: Two times a day (BID) | ORAL | Status: DC

## 2013-05-07 MED ORDER — FLUCONAZOLE 150 MG PO TABS: 150.0000 mg | ORAL_TABLET | Freq: Once | ORAL | Status: DC

## 2013-05-07 MED ORDER — HYDROCODONE-ACETAMINOPHEN 5-325 MG PO TABS: ORAL_TABLET | ORAL | Status: DC

## 2013-05-07 MED ORDER — IBUPROFEN 600 MG PO TABS: 600.0000 mg | ORAL_TABLET | Freq: Four times a day (QID) | ORAL | Status: DC | PRN

## 2013-05-07 NOTE — ED Provider Notes (Signed)
Medical screening examination/treatment/procedure(s) were performed by non-physician practitioner and as supervising physician I was immediately available for consultation/collaboration.  Megan E Docherty, MD 05/07/13 1631 

## 2013-05-07 NOTE — ED Provider Notes (Signed)
CSN: [id number]     Arrival date & time 05/07/13  1017 History   First MD Initiated Contact with Patient 05/07/13 1113     Chief Complaint  Patient presents with  . Dental Pain  . Otalgia     (Consider location/radiation/quality/duration/timing/severity/associated sxs/prior Treatment) HPI Pt is a 23yo female c/o tooth pain in right upper back molar that started 1 week ago, gradual in onset. Worsening, aching sore pain, 8/10, worse with chewing. Pt states her tooth is "falling apart." reports associated nausea and vomiting x1 two days ago. Pt also c/o left ear pain that stared 1 day ago. Hx of previous ear infections. Pain is constant, worsening, 7/10. Tylenol does not help. Denies fevers. Denies difficulty breathing or swallowing.   Past Medical History  Diagnosis Date  . Ovarian cyst 2012  . HPV (human papilloma virus) infection   . Shingles    Past Surgical History  Procedure Laterality Date  . Laceration repair    . No past surgeries     Family History  Problem Relation Age of Onset  . Cancer Mother   . Cancer Maternal Aunt    History  Substance Use Topics  . Smoking status: Current Every Day Smoker    Types: Cigarettes  . Smokeless tobacco: Not on file  . Alcohol Use: Yes     Comment: occ   OB History   Grav Para Term Preterm Abortions TAB SAB Ect Mult Living   2 2 2  0 0 0 0 0 0 2     Review of Systems  Constitutional: Negative for fever and chills.  HENT: Positive for dental problem ( right upper back tooth) and ear pain ( left). Negative for sore throat, trouble swallowing and voice change.   Gastrointestinal: Positive for nausea and vomiting ( x1). Negative for abdominal pain.  All other systems reviewed and are negative.      Allergies  Review of patient's allergies indicates no known allergies.  Home Medications   Current Outpatient Rx  Name  Route  Sig  Dispense  Refill  . amoxicillin-clavulanate (AUGMENTIN) 875-125 MG per tablet   Oral   Take 1  tablet by mouth every 12 (twelve) hours.   14 tablet   0   . fluconazole (DIFLUCAN) 150 MG tablet   Oral   Take 1 tablet (150 mg total) by mouth once.   1 tablet   0   . HYDROcodone-acetaminophen (NORCO/VICODIN) 5-325 MG per tablet      Take 1-2 pills every 4-6 hours as needed for pain.   10 tablet   0   . ibuprofen (ADVIL,MOTRIN) 600 MG tablet   Oral   Take 1 tablet (600 mg total) by mouth every 6 (six) hours as needed.   30 tablet   0    BP 108/59  Pulse 68  Temp(Src) 98.2 F (36.8 C) (Oral)  Resp 16  SpO2 100% Physical Exam  Nursing note and vitals reviewed. Constitutional: She is oriented to person, place, and time. She appears well-developed and well-nourished.  HENT:  Head: Normocephalic and atraumatic.  Right Ear: Hearing, tympanic membrane, external ear and ear canal normal. No drainage. Tympanic membrane is not erythematous and not bulging. No middle ear effusion.  Left Ear: Hearing, external ear and ear canal normal. No drainage. No mastoid tenderness. Tympanic membrane is erythematous and bulging. Tympanic membrane is not perforated. A middle ear effusion is present.  Nose: Nose normal.  Mouth/Throat: Oropharynx is clear and moist and mucous  membranes are normal. No trismus in the jaw. Abnormal dentition. Dental caries present. No dental abscesses or uvula swelling.    Dental carry and dental decay of tooth #1, right upper back. No dental abscess. No tonsillar erythema or edema  Eyes: EOM are normal.  Neck: Normal range of motion.  Cardiovascular: Normal rate.   Pulmonary/Chest: Effort normal.  Musculoskeletal: Normal range of motion.  Neurological: She is alert and oriented to person, place, and time.  Skin: Skin is warm and dry.  Psychiatric: She has a normal mood and affect. Her behavior is normal.    ED Course  Procedures (including critical care time) Labs Review Labs Reviewed - No data to display Imaging Review No results found.   EKG  Interpretation None      MDM   Final diagnoses:  Dental decay  Pain due to dental caries  Left acute otitis media    Pt c/o dental pain, dental decay present w/o abscess.  Pt also c/o left ear pain, evidence of AOM. Pt reports hx of same.  Will tx with augmentin and norco for pain. Pt reports hx of yeast infections w/ antibiotic. Requesting 1x dose of diflucan.  Advised to  f/u with Dr. Russella Dar, DDS within 48 hours. Return precautions provided. Pt verbalized understanding and agreement with tx plan.    Junius Finner, PA-C 05/07/13 1158

## 2013-05-07 NOTE — Progress Notes (Signed)
[facility] provided pt with a list of self-pay pcps to help patient establish primary care. Also, provided pt with dental resources.

## 2013-05-07 NOTE — Discharge Instructions (Signed)
Dental Caries °Dental caries is tooth decay. This decay can cause a hole in teeth (cavity) that can get bigger and deeper over time. °HOME CARE °· Brush and floss your teeth. Do this at least two times a day. °· Use a fluoride toothpaste. °· Use a mouth rinse if told by your dentist or doctor. °· Eat less sugary and starchy foods. Drink less sugary drinks. °· Avoid snacking often on sugary and starchy foods. Avoid sipping often on sugary drinks. °· Keep regular checkups and cleanings with your dentist. °· Use fluoride supplements if told by your dentist or doctor. °· Allow fluoride to be applied to teeth if told by your dentist or doctor. °MAKE SURE YOU: °· Understand these instructions. °· Will watch your condition. °· Will get help right away if you are not doing well or get worse. °Document Released: 11/04/2007 Document Revised: 09/27/2012 Document Reviewed: 01/28/2012 °ExitCare® Patient Information ©2014 [employer]. ° °

## 2013-05-07 NOTE — ED Notes (Signed)
Pt c/o upper right dental pain x 1 week and L ear pain x 1 day.  Pain score 8/10.  Pt reports that tooth is "falling apart."

## 2013-06-28 ENCOUNTER — Emergency Department (HOSPITAL_COMMUNITY): Payer: Self-pay

## 2013-06-28 ENCOUNTER — Encounter (HOSPITAL_COMMUNITY): Payer: Self-pay | Admitting: Emergency Medicine

## 2013-06-28 ENCOUNTER — Emergency Department (HOSPITAL_COMMUNITY)
Admission: EM | Admit: 2013-06-28 | Discharge: 2013-06-28 | Disposition: A | Discharge status: Home / Self Care | Payer: Self-pay | Attending: Emergency Medicine | Admitting: Emergency Medicine

## 2013-06-28 DIAGNOSIS — K029 Dental caries, unspecified: Secondary | ICD-10-CM | POA: Insufficient documentation

## 2013-06-28 DIAGNOSIS — Z8639 Personal history of other endocrine, nutritional and metabolic disease: Secondary | ICD-10-CM | POA: Insufficient documentation

## 2013-06-28 DIAGNOSIS — M79609 Pain in unspecified limb: Secondary | ICD-10-CM | POA: Insufficient documentation

## 2013-06-28 DIAGNOSIS — Z79899 Other long term (current) drug therapy: Secondary | ICD-10-CM | POA: Insufficient documentation

## 2013-06-28 DIAGNOSIS — M79641 Pain in right hand: Secondary | ICD-10-CM

## 2013-06-28 DIAGNOSIS — Z792 Long term (current) use of antibiotics: Secondary | ICD-10-CM | POA: Insufficient documentation

## 2013-06-28 DIAGNOSIS — Z862 Personal history of diseases of the blood and blood-forming organs and certain disorders involving the immune mechanism: Secondary | ICD-10-CM | POA: Insufficient documentation

## 2013-06-28 DIAGNOSIS — Z8619 Personal history of other infectious and parasitic diseases: Secondary | ICD-10-CM | POA: Insufficient documentation

## 2013-06-28 DIAGNOSIS — Z760 Encounter for issue of repeat prescription: Secondary | ICD-10-CM | POA: Insufficient documentation

## 2013-06-28 DIAGNOSIS — F17200 Nicotine dependence, unspecified, uncomplicated: Secondary | ICD-10-CM | POA: Insufficient documentation

## 2013-06-28 MED ORDER — EPINEPHRINE 0.3 MG/0.3ML IJ SOAJ: 0.3000 mg | Freq: Once | INTRAMUSCULAR | Status: DC | PRN

## 2013-06-28 MED ORDER — AMOXICILLIN 500 MG PO CAPS
500.0000 mg | ORAL_CAPSULE | Freq: Once | ORAL | Status: AC
  Administered 2013-06-28: 500 mg via ORAL
  Filled 2013-06-28: qty 1

## 2013-06-28 MED ORDER — OXYCODONE-ACETAMINOPHEN 5-325 MG PO TABS: ORAL_TABLET | ORAL | Status: DC

## 2013-06-28 MED ORDER — BUPIVACAINE-EPINEPHRINE (PF) 0.5% -1:200000 IJ SOLN: 1.8000 mL | Freq: Once | INTRAMUSCULAR | Status: DC

## 2013-06-28 MED ORDER — BUPIVACAINE-EPINEPHRINE (PF) 0.5% -1:200000 IJ SOLN
1.8000 mL | Freq: Once | INTRAMUSCULAR | Status: AC
  Administered 2013-06-28: 1.8 mL
  Filled 2013-06-28: qty 30

## 2013-06-28 MED ORDER — AMOXICILLIN 500 MG PO CAPS: 500.0000 mg | ORAL_CAPSULE | Freq: Three times a day (TID) | ORAL | Status: DC

## 2013-06-28 MED ORDER — OXYCODONE-ACETAMINOPHEN 5-325 MG PO TABS
1.0000 | ORAL_TABLET | Freq: Once | ORAL | Status: AC
  Administered 2013-06-28: 1 via ORAL
  Filled 2013-06-28: qty 1

## 2013-06-28 MED ORDER — BUPIVACAINE-EPINEPHRINE 0.5% -1:200000 IJ SOLN: 1.8000 mL | Freq: Once | INTRAMUSCULAR | Status: DC

## 2013-06-28 NOTE — ED Provider Notes (Signed)
CSN: [id number]     Arrival date & time 06/28/13  4098 History   First MD Initiated Contact with Patient 06/28/13 1009     Chief Complaint  Patient presents with  . Dental Pain  . Hand Pain     (Consider location/radiation/quality/duration/timing/severity/associated sxs/prior Treatment) HPI  [name] is a 23 y.o. female complaining of 8/10 right posterior upper dental pain secondary to cavity intermittently for the last several months, exacerbated by chewing. Patient has been taking Tylenol at home with no relief. Pain radiates to the right ear. Patient was seen for similar in the past, try to followup with [facility] but they stated that they will do not due extractions until this spring. Patient also is complaining of lump to the volar side of the right hand second MTP. Noticed it about a week ago, rates it at 7/10, states that the pain radiates down the finger. Denies numbness or weakness. Denies fever/chills, difficulty opening jaw, difficulty swallowing, SOB, gum swelling, facial swelling, neck swelling.    Past Medical History  Diagnosis Date  . Ovarian cyst 2012  . HPV (human papilloma virus) infection   . Shingles    Past Surgical History  Procedure Laterality Date  . Laceration repair    . No past surgeries     Family History  Problem Relation Age of Onset  . Cancer Mother   . Cancer Maternal Aunt    History  Substance Use Topics  . Smoking status: Current Every Day Smoker    Types: Cigarettes  . Smokeless tobacco: Not on file  . Alcohol Use: Yes     Comment: occ   OB History   Grav Para Term Preterm Abortions TAB SAB Ect Mult Living   2 2 2  0 0 0 0 0 0 2     Review of Systems  10 systems reviewed and found to be negative, except as noted in the HPI.   Allergies  Bee venom  Home Medications   Prior to Admission medications   Medication Sig Start Date End Date Taking? Authorizing Provider  amoxicillin-clavulanate (AUGMENTIN) 875-125 MG per tablet Take  1 tablet by mouth every 12 (twelve) hours. 05/07/13   Junius Finner, PA-C  fluconazole (DIFLUCAN) 150 MG tablet Take 1 tablet (150 mg total) by mouth once. 05/07/13   Junius Finner, PA-C  HYDROcodone-acetaminophen (NORCO/VICODIN) 5-325 MG per tablet Take 1-2 pills every 4-6 hours as needed for pain. 05/07/13   Junius Finner, PA-C  ibuprofen (ADVIL,MOTRIN) 600 MG tablet Take 1 tablet (600 mg total) by mouth every 6 (six) hours as needed. 05/07/13   Junius Finner, PA-C   BP 132/78  Pulse 66  Temp(Src) 98.6 F (37 C) (Oral)  Resp 20  SpO2 100%  LMP 06/27/2013 Physical Exam  Nursing note and vitals reviewed. Constitutional: She is oriented to person, place, and time. She appears well-developed and well-nourished. No distress.  HENT:  Head: Normocephalic.  Mouth/Throat: Uvula is midline and oropharynx is clear and moist. No trismus in the jaw. No uvula swelling. No oropharyngeal exudate, posterior oropharyngeal edema, posterior oropharyngeal erythema or tonsillar abscesses.    Generally poor dentition, no gingival swelling, erythema or tenderness to palpation. Patient is handling their secretions. There is no tenderness to palpation or firmness underneath tongue bilaterally. No trismus.    Eyes: Conjunctivae and EOM are normal. Pupils are equal, round, and reactive to light.  Neck: Normal range of motion. Neck supple.  Cardiovascular: Normal rate, regular rhythm and intact distal pulses.  Pulmonary/Chest: Effort normal and breath sounds normal. No stridor. No respiratory distress. She has no wheezes. She has no rales. She exhibits no tenderness.  Abdominal: Soft. Bowel sounds are normal. She exhibits no distension and no mass. There is no tenderness. There is no rebound and no guarding.  Musculoskeletal: Normal range of motion.       Hands: Lymphadenopathy:    She has no cervical adenopathy.  Neurological: She is alert and oriented to person, place, and time.  Psychiatric: She has a normal  mood and affect.    ED Course  NERVE BLOCK Date/Time: 06/28/2013 11:04 AM Performed by: Wynetta EmeryPISCIOTTA, Siarah Deleo Authorized by: Wynetta EmeryPISCIOTTA, Laini Urick Consent: Verbal consent obtained. Risks and benefits: risks, benefits and alternatives were discussed Consent given by: patient Patient identity confirmed: verbally with patient and arm band Body area: face/mouth Nerve: posterior superior alveolar Laterality: right Patient sedated: no Patient position: supine Needle gauge: 27 G Location technique: anatomical landmarks Local anesthetic: bupivacaine 0.5% without epinephrine and bupivacaine 0.5% with epinephrine Anesthetic total: 1.8 ml Outcome: pain improved Patient tolerance: Patient tolerated the procedure well with no immediate complications.   (including critical care time) Labs Review Labs Reviewed - No data to display  Imaging Review Dg Hand Complete Right  06/28/2013   CLINICAL DATA:  Index finger pain no known injury  EXAM: RIGHT HAND - COMPLETE 3+ VIEW  COMPARISON:  05/12/2011  FINDINGS: Three views of right hand submitted. No acute fracture or subluxation. No radiopaque foreign body. No periosteal reaction or bony erosion.  IMPRESSION: Negative.   Electronically Signed   By: Natasha MeadLiviu  Pop M.D.   On: 06/28/2013 11:04     EKG Interpretation None      MDM   Final diagnoses:  Pain due to dental caries  Hand pain, right  Medication refill    Filed Vitals:   06/28/13 0947 06/28/13 1114  BP: 132/78   Pulse: 76 66  Temp: 98.6 F (37 C)   TempSrc: Oral   Resp: 20   SpO2: 100% 100%    Medications  oxyCODONE-acetaminophen (PERCOCET/ROXICET) 5-325 MG per tablet 1 tablet (1 tablet Oral Given 06/28/13 1035)  amoxicillin (AMOXIL) capsule 500 mg (500 mg Oral Given 06/28/13 1035)  bupivacaine-epinephrine (MARCAINE W/ EPI) 0.5% -1:200000 injection 1.8 mL (1.8 mLs Infiltration Given 06/28/13 1104)    [name] is a 23 y.o. female presenting with  dental pain, right hand pain,  requesting refill for EpiPen. Dental block given with good relief. I believe I palpate any bony lesion in the right hand however x-rays negative. I've advised the patient to follow with hand surgery. Patient also requests an EpiPen, has a history of anaphylaxis to bee stings.  Evaluation does not show pathology that would require ongoing emergent intervention or inpatient treatment. Pt is hemodynamically stable and mentating appropriately. Discussed findings and plan with patient/guardian, who agrees with care plan. All questions answered. Return precautions discussed and outpatient follow up given.   Discharge Medication List as of 06/28/2013 10:35 AM    START taking these medications   Details  amoxicillin (AMOXIL) 500 MG capsule Take 1 capsule (500 mg total) by mouth 3 (three) times daily., Starting 06/28/2013, Until Discontinued, Print    EPINEPHrine (EPIPEN 2-PAK) 0.3 mg/0.3 mL IJ SOAJ injection Inject 0.3 mLs (0.3 mg total) into the muscle once as needed (for severe allergic reaction). CAll 911 immediately if you have to use this medicine, Starting 06/28/2013, Until Discontinued, Print    oxyCODONE-acetaminophen (PERCOCET/ROXICET) 5-325 MG per tablet  1 to 2 tabs PO q6hrs  PRN for pain, Print        Note: Portions of this report may have been transcribed using voice recognition software. Every effort was made to ensure accuracy; however, inadvertent computerized transcription errors may be present    Wynetta Emeryicole Jacari Iannello, PA-C 06/28/13 1254

## 2013-06-28 NOTE — Discharge Instructions (Signed)
Do not hesitate to return to the Emergency Department for any new, worsening or concerning symptoms.   If you do not have a primary care doctor you can establish one at the   [facility]: [address] [place] [id number] [phone]  After you establish care. Let them know you were seen in the emergency room. They must obtain records for further management.     Dental Caries  Dental caries (also called tooth decay) is the most common oral disease. It can occur at any age, but is more common in children and young adults.  HOW DENTAL CARIES DEVELOPS  The process of decay begins when bacteria and foods (particularly sugars and starches) combine in your mouth to produce plaque. Plaque is a substance that sticks to the hard, outer surface of a tooth (enamel). The bacteria in plaque produce acids that attack enamel. These acids may also attack the root surface of a tooth (cementum) if it is exposed. Repeated attacks dissolve these surfaces and create holes in the tooth (cavities). If left untreated, the acids destroy the other layers of the tooth.  RISK FACTORS  Frequent sipping of sugary beverages.   Frequent snacking on sugary and starchy foods, especially those that easily get stuck in the teeth.   Poor oral hygiene.   Dry mouth.   Substance abuse such as methamphetamine abuse.   Broken or poor-fitting dental restorations.   Eating disorders.   Gastroesophageal reflux disease (GERD).   Certain radiation treatments to the head and neck. SYMPTOMS In the early stages of dental caries, symptoms are seldom present. Sometimes white, chalky areas may be seen on the enamel or other tooth layers. In later stages, symptoms may include:  Pits and holes on the enamel.  Toothache after sweet, hot, or cold foods or drinks are consumed.  Pain around the tooth.  Swelling around the tooth. DIAGNOSIS  Most of the time, dental caries is detected during a  regular dental checkup. A diagnosis is made after a thorough medical and dental history is taken and the surfaces of your teeth are checked for signs of dental caries. Sometimes special instruments, such as lasers, are used to check for dental caries. Dental X-ray exams may be taken so that areas not visible to the eye (such as between the contact areas of the teeth) can be checked for cavities.  TREATMENT  If dental caries is in its early stages, it may be reversed with a fluoride treatment or an application of a remineralizing agent at the dental office. Thorough brushing and flossing at home is needed to aid these treatments. If it is in its later stages, treatment depends on the location and extent of tooth destruction:   If a small area of the tooth has been destroyed, the destroyed area will be removed and cavities will be filled with a material such as gold, silver amalgam, or composite resin.   If a large area of the tooth has been destroyed, the destroyed area will be removed and a cap (crown) will be fitted over the remaining tooth structure.   If the center part of the tooth (pulp) is affected, a procedure called a root canal will be needed before a filling or crown can be placed.   If most of the tooth has been destroyed, the tooth may need to be pulled (extracted). HOME CARE INSTRUCTIONS You can prevent, stop, or reverse dental caries at home by practicing good oral hygiene. Good oral hygiene includes:  Thoroughly cleaning your teeth at least twice a day with a toothbrush and dental floss.   Using a fluoride toothpaste. A fluoride mouth rinse may also be used if recommended by your dentist or health care provider.   Restricting the amount of sugary and starchy foods and sugary liquids you consume.   Avoiding frequent snacking on these foods and sipping of these liquids.   Keeping regular visits with a dentist for checkups and cleanings. PREVENTION   Practice good oral  hygiene.  Consider a dental sealant. A dental sealant is a coating material that is applied by your dentist to the pits and grooves of teeth. The sealant prevents food from being trapped in them. It may protect the teeth for several years.  Ask about fluoride supplements if you live in a community without fluorinated water or with water that has a low fluoride content. Use fluoride supplements as directed by your dentist or health care provider.  Allow fluoride varnish applications to teeth if directed by your dentist or health care provider. Document Released: 10/17/2001 Document Revised: 09/27/2012 Document Reviewed: 01/28/2012 [facility] Patient Information 2014 [place] [place]. Take percocet for breakthrough pain, do not drink alcohol, drive, care for children or do other critical tasks while taking percocet.  Return to the emergency room for fever, change in vision, redness to the face that rapidly spreads towards the eye, nausea or vomiting, difficulty swallowing or shortness of breath.   Apply warm compresses to jaw throughout the day.   Take your antibiotics as directed and to the end of the course.   Followup with a dentist is very important for ongoing evaluation and management of recurrent dental pain. Return to emergency department for emergent changing or worsening symptoms."  Low-cost dental clinic: Yancey Flemings**David  Civils  at [phone]**  **Nuala AlphaJanna Civils at [phone] [address]**    You may also call [phone]  Dental Assistance If the dentist on-call cannot see you, please use the resources below:   Patients with Medicaid: [facility] [phone], [phone] [address], [phone]  If unable to pay, or uninsured, contact [facility] [phone]) or [facility] [phone] in [place], [phone] in [place]) to become qualified for the adult dental clinic  Other Low-Cost Community Dental  Services: [facility]- [address], [place], [place], [address]    [phone], Ext. [phone]    2nd and 4th Thursday of the month at 6:30am    10 clients each day by appointment, can sometimes see walk-in     patients if someone does not show for an appointment [facility]- [address], [place], [place], [address]    [phone] [facility]- [address], [place], [place], [address]    [phone]  [facility]- [phone] [facility]- [phone] [facility]- [phone]

## 2013-06-28 NOTE — ED Notes (Addendum)
Pt reports R upper back molar pain for past month, was seen before but was unable to follow up outpatient within window. Dental cary noted to R upper back molar. Pt report she had ear ache a week ago, but none now. Also reports R index finger swelling over proximal joint. Denies injury, has been increasing over past week.

## 2013-06-29 NOTE — ED Provider Notes (Signed)
History/physical exam/procedure(s) were performed by non-physician practitioner and as supervising physician I was immediately available for consultation/collaboration. I have reviewed all notes and am in agreement with care and plan.   Ahlijah Raia S Atara Paterson, MD 06/29/13 0806 

## 2013-08-21 ENCOUNTER — Encounter (HOSPITAL_COMMUNITY): Payer: Self-pay | Admitting: Emergency Medicine

## 2013-08-21 ENCOUNTER — Emergency Department (HOSPITAL_COMMUNITY)
Admission: EM | Admit: 2013-08-21 | Discharge: 2013-08-21 | Disposition: A | Discharge status: Home / Self Care | Payer: Self-pay | Attending: Emergency Medicine | Admitting: Emergency Medicine

## 2013-08-21 DIAGNOSIS — F17200 Nicotine dependence, unspecified, uncomplicated: Secondary | ICD-10-CM | POA: Insufficient documentation

## 2013-08-21 DIAGNOSIS — Z8619 Personal history of other infectious and parasitic diseases: Secondary | ICD-10-CM | POA: Insufficient documentation

## 2013-08-21 DIAGNOSIS — Z8742 Personal history of other diseases of the female genital tract: Secondary | ICD-10-CM | POA: Insufficient documentation

## 2013-08-21 DIAGNOSIS — K0889 Other specified disorders of teeth and supporting structures: Secondary | ICD-10-CM

## 2013-08-21 DIAGNOSIS — G8929 Other chronic pain: Secondary | ICD-10-CM | POA: Insufficient documentation

## 2013-08-21 DIAGNOSIS — K0381 Cracked tooth: Secondary | ICD-10-CM | POA: Insufficient documentation

## 2013-08-21 DIAGNOSIS — Z792 Long term (current) use of antibiotics: Secondary | ICD-10-CM | POA: Insufficient documentation

## 2013-08-21 DIAGNOSIS — K089 Disorder of teeth and supporting structures, unspecified: Secondary | ICD-10-CM | POA: Insufficient documentation

## 2013-08-21 NOTE — ED Provider Notes (Signed)
CSN: [id number]     Arrival date & time 08/21/13  4098 History   First MD Initiated Contact with Patient 08/21/13 [phone]     Chief Complaint  Patient presents with  . Dental Pain     (Consider location/radiation/quality/duration/timing/severity/associated sxs/prior Treatment) Patient is a 23 y.o. female presenting with tooth pain. The history is provided by the patient.  Dental Pain Location:  Upper Upper teeth location:  1/RU 3rd molar Quality:  Dull Severity:  Mild Onset quality:  Gradual Duration:  3 months Timing:  Constant Progression:  Worsening Chronicity:  Chronic Context: dental fracture   Context: not abscess   Relieved by:  Nothing Worsened by:  Nothing tried Ineffective treatments:  None tried Associated symptoms: no facial swelling and no fever     Past Medical History  Diagnosis Date  . Ovarian cyst 2012  . HPV (human papilloma virus) infection   . Shingles    Past Surgical History  Procedure Laterality Date  . Laceration repair    . No past surgeries     Family History  Problem Relation Age of Onset  . Cancer Mother   . Cancer Maternal Aunt    History  Substance Use Topics  . Smoking status: Current Every Day Smoker -- 0.25 packs/day    Types: Cigarettes  . Smokeless tobacco: Not on file  . Alcohol Use: Yes     Comment: occ   OB History   Grav Para Term Preterm Abortions TAB SAB Ect Mult Living   2 2 2  0 0 0 0 0 0 2     Review of Systems  Constitutional: Negative for fever.  HENT: Negative for facial swelling.   All other systems reviewed and are negative.     Allergies  Bee venom  Home Medications   Prior to Admission medications   Medication Sig Start Date End Date Taking? Authorizing Provider  acetaminophen (TYLENOL) 500 MG tablet Take 500 mg by mouth every 6 (six) hours as needed for mild pain.    Historical Provider, MD  amoxicillin (AMOXIL) 500 MG capsule Take 1 capsule (500 mg total) by mouth 3 (three) times daily. 06/28/13    Nicole Pisciotta, PA-C  EPINEPHrine (EPIPEN 2-PAK) 0.3 mg/0.3 mL IJ SOAJ injection Inject 0.3 mLs (0.3 mg total) into the muscle once as needed (for severe allergic reaction). CAll 911 immediately if you have to use this medicine 06/28/13   Wynetta Emery, PA-C  oxyCODONE-acetaminophen (PERCOCET/ROXICET) 5-325 MG per tablet 1 to 2 tabs PO q6hrs  PRN for pain 06/28/13   Joni Reining Pisciotta, PA-C   BP 121/65  Pulse 100  Temp(Src) 97.9 F (36.6 C) (Oral)  Resp 18  SpO2 100% Physical Exam  Nursing note and vitals reviewed. Constitutional: She is oriented to person, place, and time. She appears well-developed and well-nourished.  Non-toxic appearance.  HENT:  Head: Normocephalic and atraumatic.  Mouth/Throat: No dental abscesses.    Eyes: Conjunctivae are normal. Pupils are equal, round, and reactive to light.  Neck: Normal range of motion.  Cardiovascular: Normal rate.   Pulmonary/Chest: Effort normal.  Neurological: She is alert and oriented to person, place, and time.  Skin: Skin is warm and dry.  Psychiatric: She has a normal mood and affect.    ED Course  Procedures (including critical care time) Labs Review Labs Reviewed - No data to display  Imaging Review No results found.   EKG Interpretation None      MDM   Final diagnoses:  Pain, dental  Pt to take motrin prn and dental resources given    Toy BakerAnthony T Brighid Koch, MD 08/21/13 [phone]

## 2013-08-21 NOTE — ED Notes (Signed)
Pt c/o R upper dental pain x 3 months.  Pain score 10/10.  [facility].  Pt reports that she does not have dental insurance and the provided resources were "too expensive."  Reports [facility] and she is hoping to have the tooth pulled at that time.

## 2013-08-21 NOTE — Discharge Instructions (Signed)

## 2013-08-21 NOTE — Progress Notes (Signed)
[facility] provided pt with a list of primary care resources, dental resources, and a [facility] orange Card application to help patient establish primary care.

## 2013-08-21 NOTE — ED Notes (Addendum)
Pt was seen by EDP and informed that she would not be receiving any medications or prescriptions, but was asked to wait for d/c paperwork.  Pt was not in room when this RN went in to discharge her.

## 2013-11-07 ENCOUNTER — Emergency Department (HOSPITAL_COMMUNITY)
Admission: EM | Admit: 2013-11-07 | Discharge: 2013-11-07 | Disposition: A | Discharge status: Home / Self Care | Payer: Self-pay | Attending: Emergency Medicine | Admitting: Emergency Medicine

## 2013-11-07 ENCOUNTER — Encounter (HOSPITAL_COMMUNITY): Payer: Self-pay | Admitting: Emergency Medicine

## 2013-11-07 DIAGNOSIS — S79929A Unspecified injury of unspecified thigh, initial encounter: Secondary | ICD-10-CM

## 2013-11-07 DIAGNOSIS — F17200 Nicotine dependence, unspecified, uncomplicated: Secondary | ICD-10-CM | POA: Insufficient documentation

## 2013-11-07 DIAGNOSIS — Z792 Long term (current) use of antibiotics: Secondary | ICD-10-CM | POA: Insufficient documentation

## 2013-11-07 DIAGNOSIS — S79919A Unspecified injury of unspecified hip, initial encounter: Secondary | ICD-10-CM | POA: Insufficient documentation

## 2013-11-07 DIAGNOSIS — Y929 Unspecified place or not applicable: Secondary | ICD-10-CM | POA: Insufficient documentation

## 2013-11-07 DIAGNOSIS — Y939 Activity, unspecified: Secondary | ICD-10-CM | POA: Insufficient documentation

## 2013-11-07 DIAGNOSIS — R000 Tachycardia, unspecified: Secondary | ICD-10-CM | POA: Insufficient documentation

## 2013-11-07 DIAGNOSIS — W108XXA Fall (on) (from) other stairs and steps, initial encounter: Secondary | ICD-10-CM | POA: Insufficient documentation

## 2013-11-07 DIAGNOSIS — S79912A Unspecified injury of left hip, initial encounter: Secondary | ICD-10-CM

## 2013-11-07 DIAGNOSIS — K029 Dental caries, unspecified: Secondary | ICD-10-CM | POA: Insufficient documentation

## 2013-11-07 DIAGNOSIS — Z8619 Personal history of other infectious and parasitic diseases: Secondary | ICD-10-CM | POA: Insufficient documentation

## 2013-11-07 DIAGNOSIS — K089 Disorder of teeth and supporting structures, unspecified: Secondary | ICD-10-CM | POA: Insufficient documentation

## 2013-11-07 DIAGNOSIS — K0889 Other specified disorders of teeth and supporting structures: Secondary | ICD-10-CM

## 2013-11-07 DIAGNOSIS — Z8742 Personal history of other diseases of the female genital tract: Secondary | ICD-10-CM | POA: Insufficient documentation

## 2013-11-07 MED ORDER — PENICILLIN V POTASSIUM 500 MG PO TABS: 500.0000 mg | ORAL_TABLET | Freq: Four times a day (QID) | ORAL | Status: DC

## 2013-11-07 MED ORDER — TRAMADOL HCL 50 MG PO TABS: 50.0000 mg | ORAL_TABLET | Freq: Four times a day (QID) | ORAL | Status: DC | PRN

## 2013-11-07 NOTE — Discharge Instructions (Signed)
Take Veetid as directed until gone. Take Tramadol as needed for pain. Refer to attached documents for more information. Return to the ED with worsening or concerning symptoms.  °

## 2013-11-07 NOTE — ED Provider Notes (Signed)
Medical screening examination/treatment/procedure(s) were performed by non-physician practitioner and as supervising physician I was immediately available for consultation/collaboration.   EKG Interpretation None        Richardean Canalavid H Yao, MD 11/07/13 [phone]

## 2013-11-07 NOTE — ED Notes (Signed)
Rt upper tooth pain x 2 weeks and fell on slick stairs last night and hurt  Left hip denies abuse

## 2013-11-07 NOTE — ED Provider Notes (Signed)
CSN: [id number]     Arrival date & time 11/07/13  [id number] History   First MD Initiated Contact with Patient 11/07/13 [phone]     Chief Complaint  Patient presents with  . Dental Pain  . Hip Pain     (Consider location/radiation/quality/duration/timing/severity/associated sxs/prior Treatment) HPI Comments: The patient is a 23 year old otherwise healthy female who presents with dental pain that started gradually one month ago. The dental pain is severe, constant and progressively worsening. The pain is aching and located in right upper jaw. The pain does not radiate. Eating makes the pain worse. Nothing makes the pain better. The patient has tried tylenol for pain without relief. No associated symptoms. Patient denies headache, neck pain/stiffness, fever, NVD, edema, sore throat, throat swelling, wheezing, SOB, chest pain, abdominal pain.   Patient also complains of left hip pain that started after fall ing down the stairs last night. Patient reports she slipped and landed on her left hip. The pain is aching and moderate without radiation. No other injury.     Past Medical History  Diagnosis Date  . Ovarian cyst 2012  . HPV (human papilloma virus) infection   . Shingles    Past Surgical History  Procedure Laterality Date  . Laceration repair    . No past surgeries     Family History  Problem Relation Age of Onset  . Cancer Mother   . Cancer Maternal Aunt    History  Substance Use Topics  . Smoking status: Current Every Day Smoker -- 0.25 packs/day    Types: Cigarettes  . Smokeless tobacco: Not on file  . Alcohol Use: Yes     Comment: occ   OB History   Grav Para Term Preterm Abortions TAB SAB Ect Mult Living   2 2 2  0 0 0 0 0 0 2     Review of Systems  Constitutional: Negative for fever, chills and fatigue.  HENT: Positive for dental problem. Negative for trouble swallowing.   Eyes: Negative for visual disturbance.  Respiratory: Negative for shortness of breath.    Cardiovascular: Negative for chest pain and palpitations.  Gastrointestinal: Negative for nausea, vomiting, abdominal pain and diarrhea.  Genitourinary: Negative for dysuria and difficulty urinating.  Musculoskeletal: Positive for arthralgias. Negative for neck pain.  Skin: Negative for color change.  Neurological: Negative for dizziness and weakness.  Psychiatric/Behavioral: Negative for dysphoric mood.      Allergies  Bee venom  Home Medications   Prior to Admission medications   Medication Sig Start Date End Date Taking? Authorizing Provider  acetaminophen (TYLENOL) 500 MG tablet Take 500 mg by mouth every 6 (six) hours as needed for mild pain.    Historical Provider, MD  amoxicillin (AMOXIL) 500 MG capsule Take 1 capsule (500 mg total) by mouth 3 (three) times daily. 06/28/13   Nicole Pisciotta, PA-C  EPINEPHrine (EPIPEN 2-PAK) 0.3 mg/0.3 mL IJ SOAJ injection Inject 0.3 mLs (0.3 mg total) into the muscle once as needed (for severe allergic reaction). CAll 911 immediately if you have to use this medicine 06/28/13   Wynetta EmeryNicole Pisciotta, PA-C  oxyCODONE-acetaminophen (PERCOCET/ROXICET) 5-325 MG per tablet 1 to 2 tabs PO q6hrs  PRN for pain 06/28/13   Joni ReiningNicole Pisciotta, PA-C   BP 111/62  Pulse 102  Temp(Src) 98.2 F (36.8 C) (Oral)  Resp 18  SpO2 99% Physical Exam  Nursing note and vitals reviewed. Constitutional: She is oriented to person, place, and time. She appears well-developed and well-nourished. No distress.  HENT:  Head: Normocephalic and atraumatic.  Fair dentition. Dental caries noted at the lateral aspect of right and left upper 4th molar. No abscess noted.   Eyes: Conjunctivae and EOM are normal.  Neck: Normal range of motion.  Cardiovascular: Normal rate and regular rhythm.  Exam reveals no gallop and no friction rub.   No murmur heard. Pulmonary/Chest: Effort normal and breath sounds normal. She has no wheezes. She has no rales. She exhibits no tenderness.   Abdominal: Soft. She exhibits no distension. There is no tenderness. There is no rebound.  Musculoskeletal: Normal range of motion.  Normal ROM of left hip. Lateral left hip tenderness to palpation. No obvious deformity.   Neurological: She is alert and oriented to person, place, and time. Coordination normal.  Speech is goal-oriented. Moves limbs without ataxia.   Skin: Skin is warm and dry.  Psychiatric: She has a normal mood and affect. Her behavior is normal.    ED Course  Procedures (including critical care time) Labs Review Labs Reviewed - No data to display  Imaging Review No results found.   EKG Interpretation None      MDM   Final diagnoses:  Pain, dental  Hip injury, left, initial encounter    7:50 AM Patient mildly tachycardic at 104 with remaining vitals stable. Patient has no signs of ludwigs angina or dental abscess. Patient will have recommended dental follow up and tramadol and veetid for symptoms. Patient instructed to return with worsening or concerning symptoms.     Emilia Beck, PA-C 11/07/13 [phone]

## 2013-11-09 ENCOUNTER — Emergency Department (HOSPITAL_COMMUNITY)
Admission: EM | Admit: 2013-11-09 | Discharge: 2013-11-09 | Disposition: A | Discharge status: Home / Self Care | Payer: Self-pay | Attending: Emergency Medicine | Admitting: Emergency Medicine

## 2013-11-09 ENCOUNTER — Encounter (HOSPITAL_COMMUNITY): Payer: Self-pay | Admitting: Emergency Medicine

## 2013-11-09 DIAGNOSIS — Z8742 Personal history of other diseases of the female genital tract: Secondary | ICD-10-CM | POA: Insufficient documentation

## 2013-11-09 DIAGNOSIS — G8918 Other acute postprocedural pain: Secondary | ICD-10-CM | POA: Insufficient documentation

## 2013-11-09 DIAGNOSIS — Z8619 Personal history of other infectious and parasitic diseases: Secondary | ICD-10-CM | POA: Insufficient documentation

## 2013-11-09 DIAGNOSIS — K002 Abnormalities of size and form of teeth: Secondary | ICD-10-CM | POA: Insufficient documentation

## 2013-11-09 DIAGNOSIS — K0889 Other specified disorders of teeth and supporting structures: Secondary | ICD-10-CM

## 2013-11-09 DIAGNOSIS — K029 Dental caries, unspecified: Secondary | ICD-10-CM | POA: Insufficient documentation

## 2013-11-09 DIAGNOSIS — Z720 Tobacco use: Secondary | ICD-10-CM | POA: Insufficient documentation

## 2013-11-09 DIAGNOSIS — K08409 Partial loss of teeth, unspecified cause, unspecified class: Secondary | ICD-10-CM | POA: Insufficient documentation

## 2013-11-09 DIAGNOSIS — R630 Anorexia: Secondary | ICD-10-CM | POA: Insufficient documentation

## 2013-11-09 DIAGNOSIS — K088 Other specified disorders of teeth and supporting structures: Secondary | ICD-10-CM | POA: Insufficient documentation

## 2013-11-09 MED ORDER — HYDROCODONE-ACETAMINOPHEN 5-325 MG PO TABS: ORAL_TABLET | ORAL | Status: DC

## 2013-11-09 MED ORDER — PENICILLIN V POTASSIUM 500 MG PO TABS: 500.0000 mg | ORAL_TABLET | Freq: Four times a day (QID) | ORAL | Status: DC

## 2013-11-09 MED ORDER — NAPROXEN 500 MG PO TABS: 500.0000 mg | ORAL_TABLET | Freq: Two times a day (BID) | ORAL | Status: DC

## 2013-11-09 MED ORDER — FLUCONAZOLE 150 MG PO TABS: 150.0000 mg | ORAL_TABLET | Freq: Every day | ORAL | Status: DC

## 2013-11-09 NOTE — ED Provider Notes (Signed)
Medical screening examination/treatment/procedure(s) were performed by non-physician practitioner and as supervising physician I was immediately available for consultation/collaboration.   EKG Interpretation None        Audree CamelScott T Robley Matassa, MD 11/09/13 1304

## 2013-11-09 NOTE — Discharge Instructions (Signed)
Please read and follow all provided instructions.  Your diagnoses today include:  1. Pain, dental     The exam and treatment you received today has been provided on an emergency basis only. This is not a substitute for complete medical or dental care.  Tests performed today include:  Vital signs. See below for your results today.   Medications prescribed:   Penicillin - antibiotic  You have been prescribed an antibiotic medicine: take the entire course of medicine even if you are feeling better. Stopping early can cause the antibiotic not to work.   Vicodin (hydrocodone/acetaminophen) - narcotic pain medication  DO NOT drive or perform any activities that require you to be awake and alert because this medicine can make you drowsy. BE VERY CAREFUL not to take multiple medicines containing Tylenol (also called acetaminophen). Doing so can lead to an overdose which can damage your liver and cause liver failure and possibly death.   Naproxen - anti-inflammatory pain medication  Do not exceed 500mg  naproxen every 12 hours, take with food  You have been prescribed an anti-inflammatory medication or NSAID. Take with food. Take smallest effective dose for the shortest duration needed for your pain. Stop taking if you experience stomach pain or vomiting.   Take any prescribed medications only as directed.  Home care instructions:  Follow any educational materials contained in this packet.  Follow-up instructions: Please follow-up with your dentist for further evaluation of your symptoms.   Dental Assistance: See below for dental referrals  Return instructions:   Please return to the Emergency Department if you experience worsening symptoms.  Please return if you develop a fever, you develop more swelling in your face or neck, you have trouble breathing or swallowing food.  Please return if you have any other emergent concerns.  Additional Information:  Your vital signs today  were: BP 132/83   Pulse 113   Temp(Src) 97.8 F (36.6 C) (Oral)   Resp 18   SpO2 100% If your blood pressure (BP) was elevated above 135/85 this visit, please have this repeated by your doctor within one month. -------------- Dental Care: Organization         Address  Phone  Notes  [facility] [address], [place] [phone] Accepts children up to age 10721 who are enrolled in [place] or [place] Health Choice; pregnant women with a Medicaid card; and children who have applied for Medicaid or [place] Health Choice, but were declined, whose parents can pay a reduced fee at time of service.  [facility] [place]  [address], [place] [phone] Accepts children up to age 25 who are enrolled in [place] or [place] Health Choice; pregnant women with a Medicaid card; and children who have applied for Medicaid or  Health Choice, but were declined, whose parents can pay a reduced fee at time of service.  [facility]  [address], [place] [phone] Patients are seen by appointment only. Walk-ins are not accepted. [facility] will see patients 25 years of age and older. Monday - Tuesday (8am-5pm) Most Wednesdays (8:30-5pm) $30 per visit, cash only  [facility]  [address], [facility] [place] [phone] Patients are seen by appointment only. Walk-ins are not accepted. [facility] will see patients 25 years of age and older. One Wednesday Evening (Monthly: Volunteer Based).  $30 per visit, cash  only  [facility]  [phone] for adults; Children under age 374, call Graduate Pediatric Dentistry at [phone]. Children aged 834-14, please call [phone] to request a pediatric application.  Dental services are provided in all areas of dental care including fillings, crowns and bridges, complete and  partial dentures, implants, gum treatment, root canals, and extractions. Preventive care is also provided. Treatment is provided to both adults and children. Patients are selected via a lottery and there is often a waiting list.   [facility] [address], [place]  [phone] [email or web address]   [facility] [address], [place], [place] [phone], [phone]. [phone] Second and Fourth Thursday of each month, opens at 6:30 AM; Clinic ends at 9 AM.  Patients are seen on a first-come first-served basis, and a limited number are seen during each clinic.   [facility]  [address], [place], [place] [phone]   Eligibility Requirements You must have lived in [place], [place], or [place] for at least the last three months.   You cannot be eligible for state or federal sponsored [place]healthcare insurance, including [employer], [place], or [employer]icare.   You generally cannot be eligible for healthcare insurance through your employer.    How to apply: Eligibility screenings are held every Tuesday and Wednesday afternoon from 1:00 pm until 4:00 pm. You do not need an appointment for the interview!  [facility] [address], [place], [place] [phone]   [facility]  [phone]   [facility]  [phone]   [facility]  [phone]

## 2013-11-09 NOTE — ED Notes (Signed)
Pt c/o R upper dental pain after having a tooth removed x 2 days ago.  Pain score 8/10.  Pt reports that she ran out of pain medication and did not get the antibiotic filled.

## 2013-11-09 NOTE — ED Provider Notes (Signed)
CSN: [id number]     Arrival date & time 11/09/13  1233 History   First MD Initiated Contact with Patient 11/09/13 1235    This chart was scribed for non-physician practitioner,[employer], PA, working with Audree Camel, MD by Marica Otter, ED Scribe. This patient was seen in room WTR5/WTR5 and the patient's care was started at 12:43 PM.  Chief Complaint  Patient presents with  . Dental Pain   The history is provided by the patient. No language interpreter was used.   PCP: No primary provider on file. HPI Comments: [name] is a 23 y.o. female, with medical Hx noted below and significant for HPV and tobacco use (.25 ppd), who presents to the Emergency Department complaining of constant, severe, non-radiating pain to, and associated discharge from, a tooth extraction site (second molar right lower). Pt reports that she had her second molar, right lower extracted 3 days ago. Pt notes that since the extraction she has been in severe pain, rating her present pain an 8 out of 10, and has been unable to eat due to the pain. Pt describes her pain as an aching pain. Pt notes that the dentist provided her with pain meds which alleviated her Sx, however, pt is currently out of pain meds. (#4 Vicodin 7.5/325 per [place] database).   Pt notes that she is not presently on any antibiotics and states that she often experiences yeast infection whenever she takes antibiotics.   Past Medical History  Diagnosis Date  . Ovarian cyst 2012  . HPV (human papilloma virus) infection   . Shingles    Past Surgical History  Procedure Laterality Date  . Laceration repair    . No past surgeries     Family History  Problem Relation Age of Onset  . Cancer Mother   . Cancer Maternal Aunt    History  Substance Use Topics  . Smoking status: Current Every Day Smoker -- 0.25 packs/day    Types: Cigarettes  . Smokeless tobacco: Not on file  . Alcohol Use: Yes     Comment: occ   OB History   Grav Para Term  Preterm Abortions TAB SAB Ect Mult Living   2 2 2  0 0 0 0 0 0 2     Review of Systems  Constitutional: Positive for appetite change (decreased intake PO due to dental pain). Negative for fever and chills.  HENT: Positive for dental problem (dental pain to right upper jaw). Negative for ear pain, facial swelling, sore throat and trouble swallowing.   Respiratory: Negative for shortness of breath and stridor.   Musculoskeletal: Negative for neck pain.  Skin: Negative for color change.  Neurological: Negative for headaches.  Psychiatric/Behavioral: Negative for confusion.      Allergies  Bee venom  Home Medications   Prior to Admission medications   Medication Sig Start Date End Date Taking? Authorizing Provider  acetaminophen (TYLENOL) 500 MG tablet Take 500 mg by mouth every 6 (six) hours as needed for mild pain.    Historical Provider, MD  EPINEPHrine (EPIPEN 2-PAK) 0.3 mg/0.3 mL IJ SOAJ injection Inject 0.3 mLs (0.3 mg total) into the muscle once as needed (for severe allergic reaction). CAll 911 immediately if you have to use this medicine 06/28/13   Wynetta Emery, PA-C  HYDROcodone-acetaminophen (NORCO/VICODIN) 5-325 MG per tablet Take 1-2 tablets every 6 hours as needed for severe pain 11/09/13   Renne Crigler, PA-C  naproxen (NAPROSYN) 500 MG tablet Take 1 tablet (500  mg total) by mouth 2 (two) times daily. 11/09/13   Renne CriglerJoshua Azlyn Wingler, PA-C  penicillin v potassium (VEETID) 500 MG tablet Take 1 tablet (500 mg total) by mouth 4 (four) times daily. 11/09/13   Renne CriglerJoshua Azrael Maddix, PA-C   Triage Vitals: BP 132/83  Pulse 113  Temp(Src) 97.8 F (36.6 C) (Oral)  Resp 18  SpO2 100% Physical Exam  Nursing note and vitals reviewed. Constitutional: She appears well-developed and well-nourished. No distress.  HENT:  Head: Normocephalic and atraumatic.  Right Ear: Tympanic membrane, external ear and ear canal normal.  Left Ear: Tympanic membrane, external ear and ear canal normal.  Nose: Nose  normal.  Mouth/Throat: Uvula is midline, oropharynx is clear and moist and mucous membranes are normal. No trismus in the jaw. Abnormal dentition. Dental caries present. No dental abscesses or uvula swelling. No tonsillar abscesses.  Patient with R maxillary tooth pain and tenderness to palpation in area of 2nd molar where she is s/p extraction. Sutures in place. Mild gum tenderness. No purulent drainage seen.   Eyes: Conjunctivae and EOM are normal.  Neck: Normal range of motion. Neck supple. No tracheal deviation present.  No neck swelling or Ludwig's angina  Cardiovascular: Normal rate.   Pulmonary/Chest: Effort normal. No respiratory distress.  Musculoskeletal: Normal range of motion.  Lymphadenopathy:    She has no cervical adenopathy.  Neurological: She is alert.  Skin: Skin is warm and dry.  Psychiatric: She has a normal mood and affect. Her behavior is normal.    ED Course  Procedures (including critical care time) DIAGNOSTIC STUDIES: Oxygen Saturation is 100% on RA, nl by my interpretation.    COORDINATION OF CARE: 12:47 PM-Discussed treatment plan which includes pain meds and follow-up with dentist with pt at bedside and pt agreed to plan.   Labs Review Labs Reviewed - No data to display  Imaging Review No results found.   EKG Interpretation None      Vital signs reviewed and are as follows: Filed Vitals:   11/09/13 1246  BP: 132/83  Pulse: 113  Temp: 97.8 F (36.6 C)  Resp: 18   Patient counseled on use of narcotic pain medications. Counseled not to combine these medications with others containing tylenol. Urged not to drink alcohol, drive, or perform any other activities that requires focus while taking these medications. The patient verbalizes understanding and agrees with the plan.  MDM   Final diagnoses:  Pain, dental   Dental pain s/p extraction. Patient reports purulent d/c.   Patient with toothache. No fever. Exam unconcerning for Ludwig's angina  or other deep tissue infection in neck.   As there is gum swelling, erythema will treat with antibiotic to cover infection and pain medicine. Urged patient to follow-up with dentist for recheck.    I personally performed the services described in this documentation, which was scribed in my presence. The recorded information has been reviewed and is accurate.      Renne CriglerJoshua Lorelei Heikkila, PA-C 11/09/13 1300

## 2013-12-10 ENCOUNTER — Encounter (HOSPITAL_COMMUNITY): Payer: Self-pay | Admitting: Emergency Medicine

## 2013-12-22 ENCOUNTER — Encounter (HOSPITAL_COMMUNITY): Payer: Self-pay | Admitting: Cardiology

## 2013-12-22 ENCOUNTER — Emergency Department (HOSPITAL_COMMUNITY): Payer: No Typology Code available for payment source

## 2013-12-22 ENCOUNTER — Emergency Department (HOSPITAL_COMMUNITY)
Admission: EM | Admit: 2013-12-22 | Discharge: 2013-12-22 | Disposition: A | Discharge status: Home / Self Care | Payer: No Typology Code available for payment source | Attending: Emergency Medicine | Admitting: Emergency Medicine

## 2013-12-22 DIAGNOSIS — Z8619 Personal history of other infectious and parasitic diseases: Secondary | ICD-10-CM | POA: Insufficient documentation

## 2013-12-22 DIAGNOSIS — S6991XA Unspecified injury of right wrist, hand and finger(s), initial encounter: Secondary | ICD-10-CM | POA: Insufficient documentation

## 2013-12-22 DIAGNOSIS — Y998 Other external cause status: Secondary | ICD-10-CM | POA: Insufficient documentation

## 2013-12-22 DIAGNOSIS — Z79899 Other long term (current) drug therapy: Secondary | ICD-10-CM | POA: Diagnosis not present

## 2013-12-22 DIAGNOSIS — Y92410 Unspecified street and highway as the place of occurrence of the external cause: Secondary | ICD-10-CM | POA: Diagnosis not present

## 2013-12-22 DIAGNOSIS — Z720 Tobacco use: Secondary | ICD-10-CM | POA: Diagnosis not present

## 2013-12-22 DIAGNOSIS — Z792 Long term (current) use of antibiotics: Secondary | ICD-10-CM | POA: Diagnosis not present

## 2013-12-22 DIAGNOSIS — S0990XA Unspecified injury of head, initial encounter: Secondary | ICD-10-CM | POA: Insufficient documentation

## 2013-12-22 DIAGNOSIS — R519 Headache, unspecified: Secondary | ICD-10-CM

## 2013-12-22 DIAGNOSIS — Z8742 Personal history of other diseases of the female genital tract: Secondary | ICD-10-CM | POA: Insufficient documentation

## 2013-12-22 DIAGNOSIS — S199XXA Unspecified injury of neck, initial encounter: Secondary | ICD-10-CM | POA: Diagnosis not present

## 2013-12-22 DIAGNOSIS — Y9389 Activity, other specified: Secondary | ICD-10-CM | POA: Diagnosis not present

## 2013-12-22 DIAGNOSIS — Z791 Long term (current) use of non-steroidal anti-inflammatories (NSAID): Secondary | ICD-10-CM | POA: Diagnosis not present

## 2013-12-22 DIAGNOSIS — R51 Headache: Secondary | ICD-10-CM

## 2013-12-22 MED ORDER — CYCLOBENZAPRINE HCL 10 MG PO TABS: 10.0000 mg | ORAL_TABLET | Freq: Two times a day (BID) | ORAL | Status: DC | PRN

## 2013-12-22 MED ORDER — IBUPROFEN 400 MG PO TABS
800.0000 mg | ORAL_TABLET | Freq: Once | ORAL | Status: AC
  Administered 2013-12-22: 800 mg via ORAL
  Filled 2013-12-22: qty 2

## 2013-12-22 MED ORDER — NAPROXEN 500 MG PO TABS: 500.0000 mg | ORAL_TABLET | Freq: Two times a day (BID) | ORAL | Status: DC

## 2013-12-22 MED ORDER — HYDROCODONE-ACETAMINOPHEN 5-325 MG PO TABS
1.0000 | ORAL_TABLET | Freq: Once | ORAL | Status: AC
  Administered 2013-12-22: 1 via ORAL
  Filled 2013-12-22: qty 1
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
## 2014-04-26 ENCOUNTER — Inpatient Hospital Stay (HOSPITAL_COMMUNITY)
Admission: AD | Admit: 2014-04-26 | Discharge: 2014-04-26 | Disposition: A | Discharge status: Home / Self Care | Payer: Medicaid Other | Source: Ambulatory Visit | Attending: Obstetrics and Gynecology | Admitting: Obstetrics and Gynecology

## 2014-04-26 ENCOUNTER — Encounter (HOSPITAL_COMMUNITY): Payer: Self-pay | Admitting: *Deleted

## 2014-04-26 DIAGNOSIS — Z3201 Encounter for pregnancy test, result positive: Secondary | ICD-10-CM | POA: Insufficient documentation

## 2014-04-26 DIAGNOSIS — Z3200 Encounter for pregnancy test, result unknown: Secondary | ICD-10-CM | POA: Diagnosis present

## 2014-04-26 DIAGNOSIS — N912 Amenorrhea, unspecified: Secondary | ICD-10-CM

## 2014-04-26 LAB — URINALYSIS, ROUTINE W REFLEX MICROSCOPIC
BILIRUBIN URINE: NEGATIVE
Glucose, UA: NEGATIVE mg/dL
HGB URINE DIPSTICK: NEGATIVE
KETONES UR: NEGATIVE mg/dL
Leukocytes, UA: NEGATIVE
Nitrite: NEGATIVE
Protein, ur: NEGATIVE mg/dL
Specific Gravity, Urine: 1.02 (ref 1.005–1.030)
Urobilinogen, UA: 0.2 mg/dL (ref 0.0–1.0)
pH: 8 (ref 5.0–8.0)

## 2014-04-26 LAB — POCT PREGNANCY, URINE: PREG TEST UR: POSITIVE — AB

## 2014-04-26 NOTE — MAU Note (Signed)
+  HPT this morning.  No problems, just needs confirmation.

## 2014-04-26 NOTE — MAU Provider Note (Signed)
S: 24 y.o. [id number] @[redacted]w[redacted]d  by LMP presents to MAU for pregnancy confirmation.  She denies abdominal pain or vaginal bleeding today.    O: BP 123/61 mmHg  Pulse 84  Temp(Src) 98.4 F (36.9 C) (Oral)  Resp 16  Ht 5' 0.5" (1.537 m)  Wt 49.896 kg (110 lb)  BMI 21.12 kg/m2  LMP 03/24/2014 (Approximate) Physical Examination: General appearance - alert, well appearing, and in no distress, oriented to person, place, and time and acyanotic, in no respiratory distress  Results for orders placed or performed during the hospital encounter of 04/26/14 (from the past 48 hour(s))  Urinalysis, Routine w reflex microscopic     Status: None   Collection Time: 04/26/14 11:35 AM  Result Value Ref Range   Color, Urine YELLOW YELLOW   APPearance CLEAR CLEAR   Specific Gravity, Urine 1.020 1.005 - 1.030   pH 8.0 5.0 - 8.0   Glucose, UA NEGATIVE NEGATIVE mg/dL   Hgb urine dipstick NEGATIVE NEGATIVE   Bilirubin Urine NEGATIVE NEGATIVE   Ketones, ur NEGATIVE NEGATIVE mg/dL   Protein, ur NEGATIVE NEGATIVE mg/dL   Urobilinogen, UA 0.2 0.0 - 1.0 mg/dL   Nitrite NEGATIVE NEGATIVE   Leukocytes, UA NEGATIVE NEGATIVE    Comment: MICROSCOPIC NOT DONE ON URINES WITH NEGATIVE PROTEIN, BLOOD, LEUKOCYTES, NITRITE, OR GLUCOSE <1000 mg/dL.  Pregnancy, urine POC     Status: Abnormal   Collection Time: 04/26/14 11:43 AM  Result Value Ref Range   Preg Test, Ur POSITIVE (A) NEGATIVE    Comment:        THE SENSITIVITY OF THIS METHODOLOGY IS >24 mIU/mL     A: Positive pregnancy test  P: D/C home Pregnancy verification letter given F/U with early prenatal care Return to MAU as needed for emergencies  LEFTWICH-KIRBY, Florella Mcneese, CNM 12:27 PM

## 2014-05-01 ENCOUNTER — Encounter (HOSPITAL_COMMUNITY): Payer: Self-pay

## 2014-05-01 ENCOUNTER — Emergency Department (HOSPITAL_COMMUNITY)
Admission: EM | Admit: 2014-05-01 | Discharge: 2014-05-01 | Disposition: A | Discharge status: Home / Self Care | Payer: Medicaid Other | Attending: Emergency Medicine | Admitting: Emergency Medicine

## 2014-05-01 DIAGNOSIS — Z8619 Personal history of other infectious and parasitic diseases: Secondary | ICD-10-CM | POA: Diagnosis not present

## 2014-05-01 DIAGNOSIS — H6001 Abscess of right external ear: Secondary | ICD-10-CM | POA: Insufficient documentation

## 2014-05-01 DIAGNOSIS — Z79899 Other long term (current) drug therapy: Secondary | ICD-10-CM | POA: Insufficient documentation

## 2014-05-01 DIAGNOSIS — Z87448 Personal history of other diseases of urinary system: Secondary | ICD-10-CM | POA: Insufficient documentation

## 2014-05-01 DIAGNOSIS — Z720 Tobacco use: Secondary | ICD-10-CM | POA: Diagnosis not present

## 2014-05-01 MED ORDER — LIDOCAINE HCL (PF) 1 % IJ SOLN
5.0000 mL | Freq: Once | INTRAMUSCULAR | Status: AC
  Administered 2014-05-01: 5 mL
  Filled 2014-05-01: qty 5

## 2014-05-01 MED ORDER — ACETAMINOPHEN 325 MG PO TABS
650.0000 mg | ORAL_TABLET | Freq: Once | ORAL | Status: AC
  Administered 2014-05-01: 650 mg via ORAL
  Filled 2014-05-01: qty 2

## 2014-05-01 NOTE — Discharge Instructions (Signed)
Read the information below.  You may return to the Emergency Department at any time for worsening condition or any new symptoms that concern you.  Take tylenol as needed for pain.  If you develop redness, swelling, pus draining from the wound, or fevers greater than 100.4, return to the ER immediately for a recheck.    Abscess Care After An abscess (also called a boil or furuncle) is an infected area that contains a collection of pus. Signs and symptoms of an abscess include pain, tenderness, redness, or hardness, or you may feel a moveable soft area under your skin. An abscess can occur anywhere in the body. The infection may spread to surrounding tissues causing cellulitis. A cut (incision) by the surgeon was made over your abscess and the pus was drained out. Gauze may have been packed into the space to provide a drain that will allow the cavity to heal from the inside outwards. The boil may be painful for 5 to 7 days. Most people with a boil do not have high fevers. Your abscess, if seen early, may not have localized, and may not have been lanced. If not, another appointment may be required for this if it does not get better on its own or with medications. HOME CARE INSTRUCTIONS   Only take over-the-counter or prescription medicines for pain, discomfort, or fever as directed by your caregiver.  When you bathe, soak and then remove gauze or iodoform packs at least daily or as directed by your caregiver. You may then wash the wound gently with mild soapy water. Repack with gauze or do as your caregiver directs. SEEK IMMEDIATE MEDICAL CARE IF:   You develop increased pain, swelling, redness, drainage, or bleeding in the wound site.  You develop signs of generalized infection including muscle aches, chills, fever, or a general ill feeling.  An oral temperature above 102 F (38.9 C) develops, not controlled by medication. See your caregiver for a recheck if you develop any of the symptoms described  above. If medications (antibiotics) were prescribed, take them as directed. Document Released: 08/13/2004 Document Revised: 04/19/2011 Document Reviewed: 04/10/2007 [facility] Patient Information 2015 [place], [place]. This information is not intended to replace advice given to you by your health care provider. Make sure you discuss any questions you have with your health care provider.  Abscess An abscess (boil or furuncle) is an infected area on or under the skin. This area is filled with yellowish-white fluid (pus) and other material (debris). HOME CARE   Only take medicines as told by your doctor.  If you were given antibiotic medicine, take it as directed. Finish the medicine even if you start to feel better.  If gauze is used, follow your doctor's directions for changing the gauze.  To avoid spreading the infection:  Keep your abscess covered with a bandage.  Wash your hands well.  Do not share personal care items, towels, or whirlpools with others.  Avoid skin contact with others.  Keep your skin and clothes clean around the abscess.  Keep all doctor visits as told. GET HELP RIGHT AWAY IF:   You have more pain, puffiness (swelling), or redness in the wound site.  You have more fluid or blood coming from the wound site.  You have muscle aches, chills, or you feel sick.  You have a fever. MAKE SURE YOU:   Understand these instructions.  Will watch your condition.  Will get help right away if you are not doing well or get worse.  Document Released: 07/14/2007 Document Revised: 07/27/2011 Document Reviewed: 04/09/2011 [facility] Patient Information 2015 [employer] [place]. This information is not intended to replace advice given to you by your health care provider. Make sure you discuss any questions you have with your health care provider.

## 2014-05-01 NOTE — ED Notes (Signed)
Patient has an abscess to right earlobe.

## 2014-05-01 NOTE — ED Provider Notes (Signed)
CSN: [id number]     Arrival date & time 05/01/14  1028 History   First MD Initiated Contact with Patient 05/01/14 1039     Chief Complaint  Patient presents with  . Abscess     (Consider location/radiation/quality/duration/timing/severity/associated sxs/prior Treatment) HPI   Patient who is currently [redacted] weeks pregnant with hx recurrent bilateral earlobe abscesses presents with pain and swelling of her right earlobe.  States she always starts with a small little knot that gets bigger and more painful and she has to come to ED and have it drained.  States it always drains pus.  Her ears are pierced but she denies wearing earrings.  Denies fevers, chills, sore throat, neck stiffness.  Has taken nothing for her pain.  Is taking prenatal vitamins.   Denies abdominal or pelvic pain, vaginal bleeding or abnormal discharge.    Past Medical History  Diagnosis Date  . Ovarian cyst 2012  . HPV (human papilloma virus) infection   . Shingles    Past Surgical History  Procedure Laterality Date  . Laceration repair    . No past surgeries     Family History  Problem Relation Age of Onset  . Cancer Mother   . Cancer Maternal Aunt    History  Substance Use Topics  . Smoking status: Current Every Day Smoker -- 0.15 packs/day    Types: Cigarettes  . Smokeless tobacco: Never Used  . Alcohol Use: Yes     Comment: occ   OB History    Gravida Para Term Preterm AB TAB SAB Ectopic Multiple Living   3 2 2  0 0 0 0 0 0 2     Review of Systems  Constitutional: Negative for fever and chills.  HENT: Positive for ear pain (earlobe only). Negative for ear discharge, facial swelling, sore throat and trouble swallowing.   Musculoskeletal: Negative for neck pain and neck stiffness.  Skin: Negative for color change and wound.  Allergic/Immunologic: Negative for immunocompromised state.  Neurological: Negative for facial asymmetry, light-headedness and headaches.  Psychiatric/Behavioral: Negative for  self-injury.      Allergies  Bee venom  Home Medications   Prior to Admission medications   Medication Sig Start Date End Date Taking? Authorizing Provider  acetaminophen (TYLENOL) 500 MG tablet Take 500 mg by mouth every 6 (six) hours as needed for mild pain.    Historical Provider, MD  cyclobenzaprine (FLEXERIL) 10 MG tablet Take 1 tablet (10 mg total) by mouth 2 (two) times daily as needed for muscle spasms. 12/22/13   Arthor CaptainAbigail Harris, PA-C  EPINEPHrine (EPIPEN 2-PAK) 0.3 mg/0.3 mL IJ SOAJ injection Inject 0.3 mLs (0.3 mg total) into the muscle once as needed (for severe allergic reaction). CAll 911 immediately if you have to use this medicine 06/28/13   Joni ReiningNicole Pisciotta, PA-C   BP 121/66 mmHg  Pulse 75  Temp(Src) 98.4 F (36.9 C) (Oral)  Resp 16  SpO2 99%  LMP 03/24/2014 (Approximate) Physical Exam  Constitutional: She appears well-developed and well-nourished. No distress.  HENT:  Head: Normocephalic and atraumatic.  Neck: Neck supple.  Pulmonary/Chest: Effort normal.  Neurological: She is alert.  Skin: She is not diaphoretic.  Right posterior earlobe with tender area of soft fluctuance.  No overlying erythema, no warmth, no discharge.   Left earlobe with firm nodule, nontender.    Nursing note and vitals reviewed.   ED Course  Procedures (including critical care time) Labs Review Labs Reviewed - No data to display  Imaging Review No  results found.   EKG Interpretation None       INCISION AND DRAINAGE Performed by: Trixie Dredge Consent: Verbal consent obtained. Risks and benefits: risks, benefits and alternatives were discussed Type: abscess  Body area: right earlobe  Anesthesia: local infiltration  Incision was made with a scalpel.  Local anesthetic: lidocaine 1 % no epinephrine  Anesthetic total: 1 ml  Complexity: complex Blunt dissection to break up loculations  Drainage: purulent  Drainage amount: moderate  Packing material:  none  Patient tolerance: Patient tolerated the procedure well with no immediate complications.     MDM   Final diagnoses:  Abscess of earlobe, right    Afebrile, nontoxic patient with recurrent abscess vs infected cyst of earlobe.  No overlying cellulitis.  D/C home with PCP/obstetrics follow up.    Discussed result, findings, treatment, and follow up  with patient.  Pt given return precautions.  Pt verbalizes understanding and agrees with plan.         Trixie Dredge, PA-C 05/01/14 1211  Purvis Sheffield, MD 05/01/14 2214

## 2014-05-06 ENCOUNTER — Encounter (HOSPITAL_COMMUNITY): Payer: Self-pay | Admitting: Emergency Medicine

## 2014-05-06 ENCOUNTER — Emergency Department (HOSPITAL_COMMUNITY)
Admission: EM | Admit: 2014-05-06 | Discharge: 2014-05-06 | Disposition: A | Discharge status: Home / Self Care | Payer: Medicaid Other | Attending: Emergency Medicine | Admitting: Emergency Medicine

## 2014-05-06 DIAGNOSIS — Z8742 Personal history of other diseases of the female genital tract: Secondary | ICD-10-CM | POA: Diagnosis not present

## 2014-05-06 DIAGNOSIS — Z8619 Personal history of other infectious and parasitic diseases: Secondary | ICD-10-CM | POA: Insufficient documentation

## 2014-05-06 DIAGNOSIS — H9202 Otalgia, left ear: Secondary | ICD-10-CM | POA: Diagnosis present

## 2014-05-06 DIAGNOSIS — L089 Local infection of the skin and subcutaneous tissue, unspecified: Secondary | ICD-10-CM | POA: Insufficient documentation

## 2014-05-06 DIAGNOSIS — Z720 Tobacco use: Secondary | ICD-10-CM | POA: Insufficient documentation

## 2014-05-06 MED ORDER — DOXYCYCLINE HYCLATE 100 MG PO CAPS: 100.0000 mg | ORAL_CAPSULE | Freq: Two times a day (BID) | ORAL | Status: DC

## 2014-05-06 MED ORDER — TRAMADOL HCL 50 MG PO TABS: 50.0000 mg | ORAL_TABLET | Freq: Four times a day (QID) | ORAL | Status: DC | PRN

## 2014-05-06 MED ORDER — CEPHALEXIN 500 MG PO CAPS: 500.0000 mg | ORAL_CAPSULE | Freq: Four times a day (QID) | ORAL | Status: DC

## 2014-05-06 NOTE — ED Provider Notes (Addendum)
CSN: [id number]     Arrival date & time 05/06/14  [id number] History   First MD Initiated Contact with Patient 05/06/14 0845     Chief Complaint  Patient presents with  . Otalgia     (Consider location/radiation/quality/duration/timing/severity/associated sxs/prior Treatment) Patient is a 24 y.o. female presenting with ear pain. The history is provided by the patient (the pt states she has pain behind her left ear.   she had an abscess drained behind right ear last week).  Otalgia Location:  Left Behind ear:  Swelling Quality:  Aching Severity:  Moderate Onset quality:  Gradual Timing:  Constant Associated symptoms: no abdominal pain, no congestion, no cough, no diarrhea, no ear discharge, no headaches and no rash     Past Medical History  Diagnosis Date  . Ovarian cyst 2012  . HPV (human papilloma virus) infection   . Shingles    Past Surgical History  Procedure Laterality Date  . Laceration repair    . No past surgeries     Family History  Problem Relation Age of Onset  . Cancer Mother   . Cancer Maternal Aunt    History  Substance Use Topics  . Smoking status: Current Every Day Smoker -- 0.15 packs/day    Types: Cigarettes  . Smokeless tobacco: Never Used  . Alcohol Use: Yes     Comment: occ   OB History    Gravida Para Term Preterm AB TAB SAB Ectopic Multiple Living   3 2 2  0 0 0 0 0 0 2     Review of Systems  Constitutional: Negative for appetite change and fatigue.  HENT: Positive for ear pain. Negative for congestion, ear discharge and sinus pressure.   Eyes: Negative for discharge.  Respiratory: Negative for cough.   Cardiovascular: Negative for chest pain.  Gastrointestinal: Negative for abdominal pain and diarrhea.  Genitourinary: Negative for frequency and hematuria.  Musculoskeletal: Negative for back pain.  Skin: Negative for rash.  Neurological: Negative for seizures and headaches.  Psychiatric/Behavioral: Negative for hallucinations.       Allergies  Bee venom  Home Medications   Prior to Admission medications   Medication Sig Start Date End Date Taking? Authorizing Provider  acetaminophen (TYLENOL) 500 MG tablet Take 500 mg by mouth every 6 (six) hours as needed (For pain.).    Yes Historical Provider, MD  Prenatal Vit-Fe Fumarate-FA (PRENATAL VITAMIN PO) Take 1 tablet by mouth every evening.    Yes Historical Provider, MD  cyclobenzaprine (FLEXERIL) 10 MG tablet Take 1 tablet (10 mg total) by mouth 2 (two) times daily as needed for muscle spasms. Patient not taking: Reported on 05/06/2014 12/22/13   Arthor CaptainAbigail Harris, PA-C  doxycycline (VIBRAMYCIN) 100 MG capsule Take 1 capsule (100 mg total) by mouth 2 (two) times daily. One po bid x 7 days 05/06/14   Bethann BerkshireJoseph Jadon Ressler, MD  EPINEPHrine (EPIPEN 2-PAK) 0.3 mg/0.3 mL IJ SOAJ injection Inject 0.3 mLs (0.3 mg total) into the muscle once as needed (for severe allergic reaction). CAll 911 immediately if you have to use this medicine Patient not taking: Reported on 05/06/2014 06/28/13   Joni ReiningNicole Pisciotta, PA-C  traMADol (ULTRAM) 50 MG tablet Take 1 tablet (50 mg total) by mouth every 6 (six) hours as needed. 05/06/14   Bethann BerkshireJoseph Crucita Lacorte, MD   BP 122/67 mmHg  Pulse 89  Temp(Src) 97.9 F (36.6 C) (Oral)  Resp 18  SpO2 100%  LMP 03/24/2014 (Approximate) Physical Exam  Constitutional: She is oriented to person, place,  and time. She appears well-developed.  HENT:  Head: Normocephalic.  Tender mild swelling behind left ear  Eyes: Conjunctivae and EOM are normal. No scleral icterus.  Neck: Neck supple. No thyromegaly present.  Cardiovascular: Normal rate and regular rhythm.  Exam reveals no gallop and no friction rub.   No murmur heard. Pulmonary/Chest: No stridor. She has no wheezes. She has no rales. She exhibits no tenderness.  Abdominal: She exhibits no distension. There is no tenderness. There is no rebound.  Musculoskeletal: Normal range of motion. She exhibits no edema.   Lymphadenopathy:    She has no cervical adenopathy.  Neurological: She is oriented to person, place, and time. She exhibits normal muscle tone. Coordination normal.  Skin: No rash noted. No erythema.  Psychiatric: She has a normal mood and affect. Her behavior is normal.    ED Course  Procedures (including critical care time) Labs Review Labs Reviewed - No data to display  Imaging Review No results found.   EKG Interpretation None      MDM   Final diagnoses:  Skin infection    Skin infection,  tx with keflex and ent follow up   Bethann Berkshire, MD 05/06/14 1028  Bethann Berkshire, MD 05/06/14 [phone]

## 2014-05-06 NOTE — ED Notes (Signed)
Pt c/o left ear pain, states she had her right ear cut opened and drained on 3/23. Pt states left ear is very painful and she cant touch it.

## 2014-05-06 NOTE — Discharge Instructions (Signed)
Follow up with Dr. Jearld FentonByers ENT md or one of his partners in 2-3 days

## 2014-06-21 LAB — OB RESULTS CONSOLE GC/CHLAMYDIA
CHLAMYDIA, DNA PROBE: NEGATIVE
GC PROBE AMP, GENITAL: NEGATIVE

## 2014-06-21 LAB — OB RESULTS CONSOLE ABO/RH: RH Type: POSITIVE

## 2014-06-21 LAB — OB RESULTS CONSOLE RUBELLA ANTIBODY, IGM: RUBELLA: IMMUNE

## 2014-06-21 LAB — OB RESULTS CONSOLE RPR: RPR: NONREACTIVE

## 2014-06-21 LAB — OB RESULTS CONSOLE HEPATITIS B SURFACE ANTIGEN: Hepatitis B Surface Ag: NEGATIVE

## 2014-06-21 LAB — OB RESULTS CONSOLE HIV ANTIBODY (ROUTINE TESTING): HIV: NONREACTIVE

## 2014-06-21 LAB — OB RESULTS CONSOLE ANTIBODY SCREEN: ANTIBODY SCREEN: NEGATIVE

## 2014-07-10 ENCOUNTER — Inpatient Hospital Stay (HOSPITAL_COMMUNITY)
Admission: AD | Admit: 2014-07-10 | Discharge: 2014-07-10 | Disposition: A | Discharge status: Home / Self Care | Payer: Medicaid Other | Source: Ambulatory Visit | Attending: Obstetrics and Gynecology | Admitting: Obstetrics and Gynecology

## 2014-07-10 ENCOUNTER — Encounter (HOSPITAL_COMMUNITY): Payer: Self-pay | Admitting: *Deleted

## 2014-07-10 DIAGNOSIS — O9989 Other specified diseases and conditions complicating pregnancy, childbirth and the puerperium: Secondary | ICD-10-CM | POA: Diagnosis not present

## 2014-07-10 DIAGNOSIS — O99332 Smoking (tobacco) complicating pregnancy, second trimester: Secondary | ICD-10-CM | POA: Diagnosis not present

## 2014-07-10 DIAGNOSIS — N898 Other specified noninflammatory disorders of vagina: Secondary | ICD-10-CM | POA: Diagnosis not present

## 2014-07-10 DIAGNOSIS — Z0000 Encounter for general adult medical examination without abnormal findings: Secondary | ICD-10-CM

## 2014-07-10 DIAGNOSIS — F17210 Nicotine dependence, cigarettes, uncomplicated: Secondary | ICD-10-CM | POA: Insufficient documentation

## 2014-07-10 DIAGNOSIS — Z3A15 15 weeks gestation of pregnancy: Secondary | ICD-10-CM

## 2014-07-10 DIAGNOSIS — N899 Noninflammatory disorder of vagina, unspecified: Secondary | ICD-10-CM | POA: Insufficient documentation

## 2014-07-10 NOTE — MAU Provider Note (Signed)
History     CSN: [id number]  Arrival date and time: 07/10/14 [id number]   First Provider Initiated Contact with Patient 07/10/14 1006      Chief Complaint  Patient presents with  . Knot in vagina    HPI  Ms. [name] is a 24 y.o. female G3P2002 at 8325w3d who presents with concerns with a knot that is in her vagina. She first noticed it last night while she was taking a shower. She put her finger in her vagina and felt it in there. The knot is not painful. The knot is hard. She has never noticed this before.  She has to reach really far back into her vagina to feel the knot.    OB History    Gravida Para Term Preterm AB TAB SAB Ectopic Multiple Living   3 2 2  0 0 0 0 0 0 2      Past Medical History  Diagnosis Date  . Ovarian cyst 2012  . HPV (human papilloma virus) infection   . Shingles     Past Surgical History  Procedure Laterality Date  . Laceration repair      Family History  Problem Relation Age of Onset  . Cancer Mother   . Cancer Maternal Aunt     History  Substance Use Topics  . Smoking status: Current Every Day Smoker -- 0.15 packs/day    Types: Cigarettes  . Smokeless tobacco: Never Used  . Alcohol Use: Yes     Comment: occ    Allergies:  Allergies  Allergen Reactions  . Bee Venom Anaphylaxis, Shortness Of Breath and Swelling    Prescriptions prior to admission  Medication Sig Dispense Refill Last Dose  . acetaminophen (TYLENOL) 500 MG tablet Take 500 mg by mouth every 6 (six) hours as needed (For pain.).    07/09/2014 at Unknown time  . Prenatal Vit-Fe Fumarate-FA (PRENATAL VITAMIN PO) Take 1 tablet by mouth every evening.    07/09/2014 at Unknown time  . cephALEXin (KEFLEX) 500 MG capsule Take 1 capsule (500 mg total) by mouth 4 (four) times daily. (Patient not taking: Reported on 07/10/2014) 40 capsule 0 Completed Course at Unknown time  . cyclobenzaprine (FLEXERIL) 10 MG tablet Take 1 tablet (10 mg total) by mouth 2 (two) times daily as needed for  muscle spasms. (Patient not taking: Reported on 05/06/2014) 20 tablet 0 Not Taking  . doxycycline (VIBRAMYCIN) 100 MG capsule Take 1 capsule (100 mg total) by mouth 2 (two) times daily. One po bid x 7 days (Patient not taking: Reported on 07/10/2014) 28 capsule 0   . EPINEPHrine (EPIPEN 2-PAK) 0.3 mg/0.3 mL IJ SOAJ injection Inject 0.3 mLs (0.3 mg total) into the muscle once as needed (for severe allergic reaction). CAll 911 immediately if you have to use this medicine (Patient not taking: Reported on 05/06/2014) 1 Device 1 emergency  . traMADol (ULTRAM) 50 MG tablet Take 1 tablet (50 mg total) by mouth every 6 (six) hours as needed. (Patient not taking: Reported on 07/10/2014) 15 tablet 0    No results found for this or any previous visit (from the past 48 hour(s)).   Review of Systems  Gastrointestinal: Negative for abdominal pain.  Genitourinary:       Denies vaginal discharge    Physical Exam   Blood pressure 102/52, pulse 79, temperature 98.4 F (36.9 C), temperature source Oral, resp. rate 16, height 5\' 1"  (1.549 m), weight 56.7 kg (125 lb), last menstrual period 03/24/2014.  Physical Exam  Constitutional: She is oriented to person, place, and time. She appears well-developed and well-nourished.  Non-toxic appearance. She does not have a sickly appearance. She does not appear ill. No distress.  HENT:  Head: Normocephalic.  Respiratory: Effort normal.  GI: Soft.  Genitourinary:  Speculum exam: Vagina - Small amount of creamy, white discharge, no odor Cervix - No contact bleeding Bimanual exam: Cervix closed Coccyx felt during bimanual exam in the posterior vagina. Upon touching the patient confirms that this is the knot she is talking about.  Uterus non tender, normal size Adnexa non tender, no masses bilaterally Chaperone present for exam.  Musculoskeletal: Normal range of motion.  Neurological: She is alert and oriented to person, place, and time.  Skin: Skin is warm. She is not  diaphoretic.  Psychiatric: Her behavior is normal.    MAU Course  Procedures  None  MDM  + fetal heart tones via doppler   Discussed patient with Dr. Henderson Cloud.   Assessment and Plan   A:  1. Normal physical exam     P:  Discharge home in stable condition Refrain from self bimanual exams Follow up with Dr. Henderson Cloud as scheduled.   Duane Lope, NP 07/10/2014 10:28 AM

## 2014-07-10 NOTE — MAU Note (Signed)
Patient states she noted a "knot" in her vagina when she took a shower last night. States it is inside and in the lower portion. Upon exam, knot is not visible. Patient states it is further inside. Informed patient NP will be in to examine her.

## 2014-11-05 ENCOUNTER — Inpatient Hospital Stay (HOSPITAL_COMMUNITY)
Admission: AD | Admit: 2014-11-05 | Discharge: 2014-11-05 | Disposition: A | Discharge status: Home Health | Payer: Medicaid Other | Source: Ambulatory Visit | Attending: Obstetrics and Gynecology | Admitting: Obstetrics and Gynecology

## 2014-11-05 ENCOUNTER — Encounter (HOSPITAL_COMMUNITY): Payer: Self-pay | Admitting: *Deleted

## 2014-11-05 ENCOUNTER — Inpatient Hospital Stay (HOSPITAL_COMMUNITY): Payer: Medicaid Other

## 2014-11-05 DIAGNOSIS — Z3A32 32 weeks gestation of pregnancy: Secondary | ICD-10-CM | POA: Insufficient documentation

## 2014-11-05 DIAGNOSIS — O4703 False labor before 37 completed weeks of gestation, third trimester: Secondary | ICD-10-CM | POA: Diagnosis not present

## 2014-11-05 DIAGNOSIS — O288 Other abnormal findings on antenatal screening of mother: Secondary | ICD-10-CM

## 2014-11-05 LAB — URINALYSIS, ROUTINE W REFLEX MICROSCOPIC
Bilirubin Urine: NEGATIVE
GLUCOSE, UA: NEGATIVE mg/dL
Hgb urine dipstick: NEGATIVE
Ketones, ur: 15 mg/dL — AB
LEUKOCYTES UA: NEGATIVE
Nitrite: NEGATIVE
PH: 6.5 (ref 5.0–8.0)
PROTEIN: NEGATIVE mg/dL
Specific Gravity, Urine: 1.01 (ref 1.005–1.030)
Urobilinogen, UA: 0.2 mg/dL (ref 0.0–1.0)

## 2014-11-05 LAB — FETAL FIBRONECTIN: Fetal Fibronectin: NEGATIVE

## 2014-11-05 MED ORDER — LACTATED RINGERS IV BOLUS (SEPSIS)
1000.0000 mL | Freq: Once | INTRAVENOUS | Status: AC
  Administered 2014-11-05: 1000 mL via INTRAVENOUS

## 2014-11-05 NOTE — MAU Provider Note (Signed)
History     CSN: [id number]  Arrival date and time: 11/05/14 1541   First Provider Initiated Contact with Patient 11/05/14 1650          Chief Complaint  Patient presents with  . Labor Eval   HPI  [name] is a 24 y.o. G3P2002 at [redacted]w[redacted]d who presents for preterm labor evaluation. Sent here from office with FFN sample to be sent to lab.  Seen in office for routine visit; talked to Dr. Henderson Cloud about low back pain & abdominal cramping.  States has been going on for the last week and happens every day. Unable to quantify how often or how many times; states in the office today it happened 4 times.  Denies vaginal bleeding or LOF.  Positive fetal movement.   OB History    Gravida Para Term Preterm AB TAB SAB Ectopic Multiple Living   0 0 0 0 0 0 2      Past Medical History  Diagnosis Date  . Ovarian cyst 2012  . HPV (human papilloma virus) infection   . Shingles     Past Surgical History  Procedure Laterality Date  . Laceration repair      Family History  Problem Relation Age of Onset  . Cancer Mother   . Cancer Maternal Aunt     Social History  Substance Use Topics  . Smoking status: Current Every Day Smoker -- 0.15 packs/day    Types: Cigarettes  . Smokeless tobacco: Never Used  . Alcohol Use: Yes     Comment: occ    Allergies:  Allergies  Allergen Reactions  . Bee Venom Anaphylaxis, Shortness Of Breath and Swelling    Prescriptions prior to admission  Medication Sig Dispense Refill Last Dose  . acetaminophen (TYLENOL) 500 MG tablet Take 1,000 mg by mouth every 6 (six) hours as needed for moderate pain (back pain).    11/04/2014 at Unknown time  . calcium carbonate (TUMS - DOSED IN MG ELEMENTAL CALCIUM) 500 MG chewable tablet Chew 1 tablet by mouth daily as needed for indigestion or heartburn.   Past Week at Unknown time  . EPINEPHrine 0.3 mg/0.3 mL IJ SOAJ injection Inject 0.3 mg into the muscle once.   rescue  . Prenatal Vit-Fe Fumarate-FA  (PRENATAL MULTIVITAMIN) TABS tablet Take 1 tablet by mouth daily at 12 noon.   11/05/2014 at 0900    Review of Systems  Constitutional: Negative.   Gastrointestinal: Positive for abdominal pain. Negative for nausea, vomiting, diarrhea and constipation.  Genitourinary: Negative for dysuria.       No vaginal bleeding or LOF   Physical Exam   Blood pressure 121/65, pulse 82, temperature 98.2 F (36.8 C), resp. rate 16, last menstrual period 03/24/2014.  Physical Exam  Nursing note and vitals reviewed. Constitutional: She is oriented to person, place, and time. She appears well-developed and well-nourished. No distress.  HENT:  Head: Normocephalic and atraumatic.  Eyes: Conjunctivae are normal. Right eye exhibits no discharge. Left eye exhibits no discharge. No scleral icterus.  Neck: Normal range of motion.  Cardiovascular: Normal rate.   Respiratory: Effort normal. No respiratory distress.  Neurological: She is alert and oriented to person, place, and time.  Skin: Skin is warm and dry. She is not diaphoretic.  Psychiatric: She has a normal mood and affect. Her behavior is normal. Judgment and thought content normal.   Dilation: 1 Effacement (%): Thick Cervical Position: Posterior Station: -3 Exam by:: Judeth Horn NP  Fetal Tracing:  Baseline: 130 Variability: moderate Accelerations: 10x10 Decelerations: none  Toco: x 4 in 1 hour   MAU Course  Procedures Results for orders placed or performed during the hospital encounter of 11/05/14 (from the past 24 hour(s))  Fetal fibronectin     Status: None   Collection Time: 11/05/14  4:15 PM  Result Value Ref Range   Fetal Fibronectin NEGATIVE NEGATIVE  Urinalysis, Routine w reflex microscopic (not at [facility])     Status: Abnormal   Collection Time: 11/05/14  4:20 PM  Result Value Ref Range   Color, Urine YELLOW YELLOW   APPearance CLEAR CLEAR   Specific Gravity, Urine 1.010 1.005 - 1.030   pH 6.5 5.0 - 8.0   Glucose, UA  NEGATIVE NEGATIVE mg/dL   Hgb urine dipstick NEGATIVE NEGATIVE   Bilirubin Urine NEGATIVE NEGATIVE   Ketones, ur 15 (A) NEGATIVE mg/dL   Protein, ur NEGATIVE NEGATIVE mg/dL   Urobilinogen, UA 0.2 0.0 - 1.0 mg/dL   Nitrite NEGATIVE NEGATIVE   Leukocytes, UA NEGATIVE NEGATIVE      MDM Pt sent from office by Dr. Henderson Cloud for FFN, monitoring & reevaluation of cervix. IV fluids and/or procardia as needed. Dilated 1 cm in office this afternoon.   IV fluid bolus given Fetal tracing is reassuring but not reactive. Irregular contractions.  FFN negative.  D/w Dr. Claiborne Billings. If FHT not reactive after vibroacoustic stimulation, send for BPP. Pt can be discharged with normal tracing or BPP 8/8.   Ultrasound - BPP 8/8, normal AFI Pt states abdominal cramping improved  Assessment and Plan  A: 1. Preterm contractions, third trimester   2. Non-reactive NST (non-stress test)     P: Discharge home Keep schedule prenatal visits Discussed reasons to return to MAU - preterm labor precautions  Judeth Horn, NP  11/05/2014, 4:48 PM

## 2014-11-05 NOTE — Discharge Instructions (Signed)
Preterm Labor Information Preterm labor is when labor starts at less than 37 weeks of pregnancy. The normal length of a pregnancy is 39 to 41 weeks. CAUSES Often, there is no identifiable underlying cause as to why a woman goes into preterm labor. One of the most common known causes of preterm labor is infection. Infections of the uterus, cervix, vagina, amniotic sac, bladder, kidney, or even the lungs (pneumonia) can cause labor to start. Other suspected causes of preterm labor include:   Urogenital infections, such as yeast infections and bacterial vaginosis.   Uterine abnormalities (uterine shape, uterine septum, fibroids, or bleeding from the placenta).   A cervix that has been operated on (it may fail to stay closed).   Malformations in the fetus.   Multiple gestations (twins, triplets, and so on).   Breakage of the amniotic sac.  RISK FACTORS 1. Having a previous history of preterm labor.  2. Having premature rupture of membranes (PROM).  3. Having a placenta that covers the opening of the cervix (placenta previa).  4. Having a placenta that separates from the uterus (placental abruption).  5. Having a cervix that is too weak to hold the fetus in the uterus (incompetent cervix).  6. Having too much fluid in the amniotic sac (polyhydramnios).  7. Taking illegal drugs or smoking while pregnant.  8. Not gaining enough weight while pregnant.  9. Being younger than 18 and older than 26 years old.  10. Having a low socioeconomic status.  11. Being African American. SYMPTOMS Signs and symptoms of preterm labor include:   Menstrual-like cramps, abdominal pain, or back pain.  Uterine contractions that are regular, as frequent as six in an hour, regardless of their intensity (may be mild or painful).  Contractions that start on the top of the uterus and spread down to the lower abdomen and back.   A sense of increased pelvic pressure.   A watery or bloody mucus  discharge that comes from the vagina.  TREATMENT Depending on the length of the pregnancy and other circumstances, your health care provider may suggest bed rest. If necessary, there are medicines that can be given to stop contractions and to mature the fetal lungs. If labor happens before 34 weeks of pregnancy, a prolonged hospital stay may be recommended. Treatment depends on the condition of both you and the fetus.  WHAT SHOULD YOU DO IF YOU THINK YOU ARE IN PRETERM LABOR? Call your health care provider right away. You will need to go to the hospital to get checked immediately. HOW CAN YOU PREVENT PRETERM LABOR IN FUTURE PREGNANCIES? You should:   Stop smoking if you smoke.  Maintain healthy weight gain and avoid chemicals and drugs that are not necessary.  Be watchful for any type of infection.  Inform your health care provider if you have a known history of preterm labor. Document Released: 04/17/2003 Document Revised: 09/27/2012 Document Reviewed: 02/28/2012 ExitCare Patient Information 2015 [employer]. This information is not intended to replace advice given to you by your health care provider. Make sure you discuss any questions you have with your health care provider.  Fetal Movement Counts Patient Name: __________________________________________________ Patient Due Date: ____________________ Performing a fetal movement count is highly recommended in high-risk pregnancies, but it is good for every pregnant woman to do. Your health care provider may ask you to start counting fetal movements at 28 weeks of the pregnancy. Fetal movements often increase:  After eating a full meal.  After physical activity.    After eating or drinking something sweet or cold.  At rest. Pay attention to when you feel the baby is most active. This will help you notice a pattern of your baby's sleep and wake cycles and what factors contribute to an increase in fetal movement. It is important to  perform a fetal movement count at the same time each day when your baby is normally most active.  HOW TO COUNT FETAL MOVEMENTS 12. Find a quiet and comfortable area to sit or lie down on your left side. Lying on your left side provides the best blood and oxygen circulation to your baby. 13. Write down the day and time on a sheet of paper or in a journal. 14. Start counting kicks, flutters, swishes, rolls, or jabs in a 2-hour period. You should feel at least 10 movements within 2 hours. 15. If you do not feel 10 movements in 2 hours, wait 2-3 hours and count again. Look for a change in the pattern or not enough counts in 2 hours. SEEK MEDICAL CARE IF:  You feel less than 10 counts in 2 hours, tried twice.  There is no movement in over an hour.  The pattern is changing or taking longer each day to reach 10 counts in 2 hours.  You feel the baby is not moving as he or she usually does. Date: ____________ Movements: ____________ Start time: ____________ Finish time: ____________  Date: ____________ Movements: ____________ Start time: ____________ Finish time: ____________ Date: ____________ Movements: ____________ Start time: ____________ Finish time: ____________ Date: ____________ Movements: ____________ Start time: ____________ Finish time: ____________ Date: ____________ Movements: ____________ Start time: ____________ Finish time: ____________ Date: ____________ Movements: ____________ Start time: ____________ Finish time: ____________ Date: ____________ Movements: ____________ Start time: ____________ Finish time: ____________ Date: ____________ Movements: ____________ Start time: ____________ Finish time: ____________  Date: ____________ Movements: ____________ Start time: ____________ Finish time: ____________ Date: ____________ Movements: ____________ Start time: ____________ Finish time: ____________ Date: ____________ Movements: ____________ Start time: ____________ Finish time:  ____________ Date: ____________ Movements: ____________ Start time: ____________ Finish time: ____________ Date: ____________ Movements: ____________ Start time: ____________ Finish time: ____________ Date: ____________ Movements: ____________ Start time: ____________ Finish time: ____________ Date: ____________ Movements: ____________ Start time: ____________ Finish time: ____________  Date: ____________ Movements: ____________ Start time: ____________ Finish time: ____________ Date: ____________ Movements: ____________ Start time: ____________ Finish time: ____________ Date: ____________ Movements: ____________ Start time: ____________ Finish time: ____________ Date: ____________ Movements: ____________ Start time: ____________ Finish time: ____________ Date: ____________ Movements: ____________ Start time: ____________ Finish time: ____________ Date: ____________ Movements: ____________ Start time: ____________ Finish time: ____________ Date: ____________ Movements: ____________ Start time: ____________ Finish time: ____________  Date: ____________ Movements: ____________ Start time: ____________ Finish time: ____________ Date: ____________ Movements: ____________ Start time: ____________ Finish time: ____________ Date: ____________ Movements: ____________ Start time: ____________ Finish time: ____________ Date: ____________ Movements: ____________ Start time: ____________ Finish time: ____________ Date: ____________ Movements: ____________ Start time: ____________ Finish time: ____________ Date: ____________ Movements: ____________ Start time: ____________ Finish time: ____________ Date: ____________ Movements: ____________ Start time: ____________ Finish time: ____________  Date: ____________ Movements: ____________ Start time: ____________ Finish time: ____________ Date: ____________ Movements: ____________ Start time: ____________ Finish time: ____________ Date: ____________ Movements:  ____________ Start time: ____________ Finish time: ____________ Date: ____________ Movements: ____________ Start time: ____________ Finish time: ____________ Date: ____________ Movements: ____________ Start time: ____________ Finish time: ____________ Date: ____________ Movements: ____________ Start time: ____________ Finish time: ____________ Date: ____________ Movements: ____________ Start time: ____________ Finish time: ____________    Date: ____________ Movements: ____________ Start time: ____________ Finish time: ____________ Date: ____________ Movements: ____________ Start time: ____________ Finish time: ____________ Date: ____________ Movements: ____________ Start time: ____________ Finish time: ____________ Date: ____________ Movements: ____________ Start time: ____________ Finish time: ____________ Date: ____________ Movements: ____________ Start time: ____________ Finish time: ____________ Date: ____________ Movements: ____________ Start time: ____________ Finish time: ____________ Date: ____________ Movements: ____________ Start time: ____________ Finish time: ____________  Date: ____________ Movements: ____________ Start time: ____________ Finish time: ____________ Date: ____________ Movements: ____________ Start time: ____________ Finish time: ____________ Date: ____________ Movements: ____________ Start time: ____________ Finish time: ____________ Date: ____________ Movements: ____________ Start time: ____________ Finish time: ____________ Date: ____________ Movements: ____________ Start time: ____________ Finish time: ____________ Date: ____________ Movements: ____________ Start time: ____________ Finish time: ____________ Date: ____________ Movements: ____________ Start time: ____________ Finish time: ____________  Date: ____________ Movements: ____________ Start time: ____________ Finish time: ____________ Date: ____________ Movements: ____________ Start time: ____________ Finish  time: ____________ Date: ____________ Movements: ____________ Start time: ____________ Finish time: ____________ Date: ____________ Movements: ____________ Start time: ____________ Finish time: ____________ Date: ____________ Movements: ____________ Start time: ____________ Finish time: ____________ Date: ____________ Movements: ____________ Start time: ____________ Finish time: ____________ Document Released: 02/24/2006 Document Revised: 06/11/2013 Document Reviewed: 11/22/2011 ExitCare Patient Information 2015 [employer]. This information is not intended to replace advice given to you by your health care provider. Make sure you discuss any questions you have with your health care provider.  

## 2014-11-05 NOTE — MAU Note (Signed)
Sent from [facility] office for ? Preterm labor;

## 2014-12-06 LAB — OB RESULTS CONSOLE GBS: GBS: POSITIVE

## 2014-12-19 ENCOUNTER — Inpatient Hospital Stay (HOSPITAL_COMMUNITY)
Admission: AD | Admit: 2014-12-19 | Discharge: 2014-12-19 | Disposition: A | Discharge status: Home / Self Care | Payer: Medicaid Other | Source: Ambulatory Visit | Attending: Obstetrics and Gynecology | Admitting: Obstetrics and Gynecology

## 2014-12-19 ENCOUNTER — Encounter (HOSPITAL_COMMUNITY): Payer: Self-pay | Admitting: *Deleted

## 2014-12-19 DIAGNOSIS — Z3493 Encounter for supervision of normal pregnancy, unspecified, third trimester: Secondary | ICD-10-CM | POA: Insufficient documentation

## 2014-12-19 NOTE — Progress Notes (Signed)
Dr Claiborne Billingsallahan notified of pt's VE, FHR pattern, orders received to discharge home

## 2014-12-19 NOTE — Discharge Instructions (Signed)

## 2014-12-19 NOTE — MAU Note (Signed)
Pt presents to MAU with complaints of contractions with back pain since last night. Denies any vaginal bleeding or LOF

## 2014-12-22 ENCOUNTER — Inpatient Hospital Stay (HOSPITAL_COMMUNITY)
Admission: AD | Admit: 2014-12-22 | Discharge: 2014-12-25 | DRG: 775 | Disposition: A | Discharge status: Home / Self Care | Payer: Medicaid Other | Source: Ambulatory Visit | Attending: Obstetrics and Gynecology | Admitting: Obstetrics and Gynecology

## 2014-12-22 ENCOUNTER — Encounter (HOSPITAL_COMMUNITY): Payer: Self-pay | Admitting: *Deleted

## 2014-12-22 DIAGNOSIS — K219 Gastro-esophageal reflux disease without esophagitis: Secondary | ICD-10-CM | POA: Diagnosis present

## 2014-12-22 DIAGNOSIS — F17210 Nicotine dependence, cigarettes, uncomplicated: Secondary | ICD-10-CM | POA: Diagnosis present

## 2014-12-22 DIAGNOSIS — Z3A39 39 weeks gestation of pregnancy: Secondary | ICD-10-CM

## 2014-12-22 DIAGNOSIS — O99334 Smoking (tobacco) complicating childbirth: Secondary | ICD-10-CM | POA: Diagnosis present

## 2014-12-22 DIAGNOSIS — O9962 Diseases of the digestive system complicating childbirth: Secondary | ICD-10-CM | POA: Diagnosis present

## 2014-12-22 DIAGNOSIS — O368390 Maternal care for abnormalities of the fetal heart rate or rhythm, unspecified trimester, not applicable or unspecified: Secondary | ICD-10-CM

## 2014-12-22 NOTE — MAU Note (Signed)
Pt c/o contractions every 5 mins since 6pm. Had a small gush of fluid. Last felt baby move 2 hours ago. Cervix 1.5 cm on Thursday.

## 2014-12-23 ENCOUNTER — Inpatient Hospital Stay (HOSPITAL_COMMUNITY): Payer: Medicaid Other | Admitting: Anesthesiology

## 2014-12-23 ENCOUNTER — Inpatient Hospital Stay (HOSPITAL_COMMUNITY): Payer: Medicaid Other

## 2014-12-23 ENCOUNTER — Encounter (HOSPITAL_COMMUNITY): Payer: Self-pay

## 2014-12-23 DIAGNOSIS — K219 Gastro-esophageal reflux disease without esophagitis: Secondary | ICD-10-CM | POA: Diagnosis present

## 2014-12-23 DIAGNOSIS — O99334 Smoking (tobacco) complicating childbirth: Secondary | ICD-10-CM | POA: Diagnosis present

## 2014-12-23 DIAGNOSIS — F17210 Nicotine dependence, cigarettes, uncomplicated: Secondary | ICD-10-CM | POA: Diagnosis present

## 2014-12-23 DIAGNOSIS — Z3A39 39 weeks gestation of pregnancy: Secondary | ICD-10-CM | POA: Diagnosis not present

## 2014-12-23 DIAGNOSIS — O9962 Diseases of the digestive system complicating childbirth: Secondary | ICD-10-CM | POA: Diagnosis present

## 2014-12-23 LAB — CBC
HEMATOCRIT: 35.6 % — AB (ref 36.0–46.0)
HEMOGLOBIN: 12.4 g/dL (ref 12.0–15.0)
MCH: 31.9 pg (ref 26.0–34.0)
MCHC: 34.8 g/dL (ref 30.0–36.0)
MCV: 91.5 fL (ref 78.0–100.0)
Platelets: 294 10*3/uL (ref 150–400)
RBC: 3.89 MIL/uL (ref 3.87–5.11)
RDW: 13.6 % (ref 11.5–15.5)
WBC: 19.2 10*3/uL — ABNORMAL HIGH (ref 4.0–10.5)

## 2014-12-23 LAB — RPR: RPR Ser Ql: NONREACTIVE

## 2014-12-23 LAB — TYPE AND SCREEN
ABO/RH(D): O POS
Antibody Screen: NEGATIVE

## 2014-12-23 LAB — ABO/RH: ABO/RH(D): O POS

## 2014-12-23 MED ORDER — DIPHENHYDRAMINE HCL 25 MG PO CAPS: 25.0000 mg | ORAL_CAPSULE | Freq: Four times a day (QID) | ORAL | Status: DC | PRN

## 2014-12-23 MED ORDER — FENTANYL 2.5 MCG/ML BUPIVACAINE 1/10 % EPIDURAL INFUSION (WH - ANES)
14.0000 mL/h | INTRAMUSCULAR | Status: DC | PRN
  Administered 2014-12-23: 11 mL/h via EPIDURAL

## 2014-12-23 MED ORDER — DIPHENHYDRAMINE HCL 50 MG/ML IJ SOLN: 12.5000 mg | INTRAMUSCULAR | Status: DC | PRN

## 2014-12-23 MED ORDER — OXYTOCIN 40 UNITS IN LACTATED RINGERS INFUSION - SIMPLE MED
1.0000 m[IU]/min | INTRAVENOUS | Status: DC
  Administered 2014-12-23: 2 m[IU]/min via INTRAVENOUS

## 2014-12-23 MED ORDER — OXYTOCIN 40 UNITS IN LACTATED RINGERS INFUSION - SIMPLE MED
62.5000 mL/h | INTRAVENOUS | Status: DC
  Filled 2014-12-23: qty 1000

## 2014-12-23 MED ORDER — LACTATED RINGERS IV SOLN
INTRAVENOUS | Status: DC
  Administered 2014-12-23 (×2): via INTRAVENOUS

## 2014-12-23 MED ORDER — BUTORPHANOL TARTRATE 1 MG/ML IJ SOLN
1.0000 mg | INTRAMUSCULAR | Status: DC | PRN
Start: 2014-12-23 — End: 2014-12-23

## 2014-12-23 MED ORDER — CITRIC ACID-SODIUM CITRATE 334-500 MG/5ML PO SOLN
30.0000 mL | ORAL | Status: DC | PRN
  Administered 2014-12-23: 30 mL via ORAL
  Filled 2014-12-23: qty 15

## 2014-12-23 MED ORDER — METHYLERGONOVINE MALEATE 0.2 MG PO TABS: 0.2000 mg | ORAL_TABLET | ORAL | Status: DC | PRN

## 2014-12-23 MED ORDER — PRENATAL MULTIVITAMIN CH
1.0000 | ORAL_TABLET | Freq: Every day | ORAL | Status: DC
  Administered 2014-12-24: 1 via ORAL
  Filled 2014-12-23 (×2): qty 1

## 2014-12-23 MED ORDER — LIDOCAINE HCL (PF) 1 % IJ SOLN
INTRAMUSCULAR | Status: DC | PRN
  Administered 2014-12-23: 3 mL
  Administered 2014-12-23: 4 mL via EPIDURAL

## 2014-12-23 MED ORDER — EPHEDRINE 5 MG/ML INJ
10.0000 mg | INTRAVENOUS | Status: DC | PRN
  Filled 2014-12-23: qty 2

## 2014-12-23 MED ORDER — OXYTOCIN BOLUS FROM INFUSION
500.0000 mL | INTRAVENOUS | Status: DC
  Administered 2014-12-23: 500 mL via INTRAVENOUS

## 2014-12-23 MED ORDER — INFLUENZA VAC SPLIT QUAD 0.5 ML IM SUSY: 0.5000 mL | PREFILLED_SYRINGE | INTRAMUSCULAR | Status: DC

## 2014-12-23 MED ORDER — ACETAMINOPHEN 325 MG PO TABS: 650.0000 mg | ORAL_TABLET | ORAL | Status: DC | PRN

## 2014-12-23 MED ORDER — PHENYLEPHRINE 40 MCG/ML (10ML) SYRINGE FOR IV PUSH (FOR BLOOD PRESSURE SUPPORT)
80.0000 ug | PREFILLED_SYRINGE | INTRAVENOUS | Status: DC | PRN
  Filled 2014-12-23: qty 2

## 2014-12-23 MED ORDER — OXYCODONE-ACETAMINOPHEN 5-325 MG PO TABS
1.0000 | ORAL_TABLET | ORAL | Status: DC | PRN
  Administered 2014-12-23 – 2014-12-25 (×7): 1 via ORAL
  Filled 2014-12-23 (×7): qty 1

## 2014-12-23 MED ORDER — ONDANSETRON HCL 4 MG/2ML IJ SOLN: 4.0000 mg | INTRAMUSCULAR | Status: DC | PRN

## 2014-12-23 MED ORDER — IBUPROFEN 800 MG PO TABS
800.0000 mg | ORAL_TABLET | Freq: Three times a day (TID) | ORAL | Status: DC
  Administered 2014-12-23 – 2014-12-25 (×5): 800 mg via ORAL
  Filled 2014-12-23 (×5): qty 1

## 2014-12-23 MED ORDER — SODIUM CHLORIDE 0.9 % IJ SOLN: 3.0000 mL | INTRAMUSCULAR | Status: DC | PRN

## 2014-12-23 MED ORDER — LIDOCAINE HCL (PF) 1 % IJ SOLN
30.0000 mL | INTRAMUSCULAR | Status: DC | PRN
  Filled 2014-12-23: qty 30

## 2014-12-23 MED ORDER — OXYCODONE-ACETAMINOPHEN 5-325 MG PO TABS: 1.0000 | ORAL_TABLET | ORAL | Status: DC | PRN

## 2014-12-23 MED ORDER — WITCH HAZEL-GLYCERIN EX PADS: 1.0000 "application " | MEDICATED_PAD | CUTANEOUS | Status: DC | PRN

## 2014-12-23 MED ORDER — LANOLIN HYDROUS EX OINT: TOPICAL_OINTMENT | CUTANEOUS | Status: DC | PRN

## 2014-12-23 MED ORDER — PENICILLIN G POTASSIUM 5000000 UNITS IJ SOLR
5.0000 10*6.[IU] | Freq: Once | INTRAVENOUS | Status: AC
  Administered 2014-12-23: 5 10*6.[IU] via INTRAVENOUS
  Filled 2014-12-23: qty 5

## 2014-12-23 MED ORDER — FLEET ENEMA 7-19 GM/118ML RE ENEM: 1.0000 | ENEMA | RECTAL | Status: DC | PRN

## 2014-12-23 MED ORDER — OXYCODONE-ACETAMINOPHEN 5-325 MG PO TABS: 2.0000 | ORAL_TABLET | ORAL | Status: DC | PRN

## 2014-12-23 MED ORDER — SENNOSIDES-DOCUSATE SODIUM 8.6-50 MG PO TABS
2.0000 | ORAL_TABLET | ORAL | Status: DC
  Administered 2014-12-23 – 2014-12-25 (×2): 2 via ORAL
  Filled 2014-12-23 (×2): qty 2

## 2014-12-23 MED ORDER — FENTANYL 2.5 MCG/ML BUPIVACAINE 1/10 % EPIDURAL INFUSION (WH - ANES)
INTRAMUSCULAR | Status: DC
Start: 2014-12-23 — End: 2014-12-23
  Filled 2014-12-23: qty 125

## 2014-12-23 MED ORDER — PENICILLIN G POTASSIUM 5000000 UNITS IJ SOLR
2.5000 10*6.[IU] | INTRAVENOUS | Status: DC
  Administered 2014-12-23 (×2): 2.5 10*6.[IU] via INTRAVENOUS
  Filled 2014-12-23 (×6): qty 2.5

## 2014-12-23 MED ORDER — SIMETHICONE 80 MG PO CHEW: 80.0000 mg | CHEWABLE_TABLET | ORAL | Status: DC | PRN

## 2014-12-23 MED ORDER — DIBUCAINE 1 % RE OINT: 1.0000 "application " | TOPICAL_OINTMENT | RECTAL | Status: DC | PRN

## 2014-12-23 MED ORDER — TERBUTALINE SULFATE 1 MG/ML IJ SOLN
0.2500 mg | Freq: Once | INTRAMUSCULAR | Status: DC | PRN
Start: 2014-12-23 — End: 2014-12-23

## 2014-12-23 MED ORDER — ZOLPIDEM TARTRATE 5 MG PO TABS
5.0000 mg | ORAL_TABLET | Freq: Every evening | ORAL | Status: DC | PRN
  Administered 2014-12-25: 5 mg via ORAL
  Filled 2014-12-23: qty 1

## 2014-12-23 MED ORDER — SODIUM CHLORIDE 0.9 % IV SOLN: 250.0000 mL | INTRAVENOUS | Status: DC | PRN

## 2014-12-23 MED ORDER — ONDANSETRON HCL 4 MG/2ML IJ SOLN: 4.0000 mg | Freq: Four times a day (QID) | INTRAMUSCULAR | Status: DC | PRN

## 2014-12-23 MED ORDER — BENZOCAINE-MENTHOL 20-0.5 % EX AERO
1.0000 "application " | INHALATION_SPRAY | CUTANEOUS | Status: DC | PRN
  Administered 2014-12-23: 1 via TOPICAL
  Filled 2014-12-23: qty 56

## 2014-12-23 MED ORDER — METHYLERGONOVINE MALEATE 0.2 MG/ML IJ SOLN: 0.2000 mg | INTRAMUSCULAR | Status: DC | PRN

## 2014-12-23 MED ORDER — MAGNESIUM HYDROXIDE 400 MG/5ML PO SUSP: 30.0000 mL | ORAL | Status: DC | PRN

## 2014-12-23 MED ORDER — ACETAMINOPHEN 325 MG PO TABS
650.0000 mg | ORAL_TABLET | ORAL | Status: DC | PRN
Start: 2014-12-23 — End: 2014-12-23

## 2014-12-23 MED ORDER — MEASLES, MUMPS & RUBELLA VAC ~~LOC~~ INJ: 0.5000 mL | INJECTION | Freq: Once | SUBCUTANEOUS | Status: DC

## 2014-12-23 MED ORDER — PHENYLEPHRINE 40 MCG/ML (10ML) SYRINGE FOR IV PUSH (FOR BLOOD PRESSURE SUPPORT)
PREFILLED_SYRINGE | INTRAVENOUS | Status: AC
  Filled 2014-12-23: qty 20

## 2014-12-23 MED ORDER — ONDANSETRON HCL 4 MG PO TABS: 4.0000 mg | ORAL_TABLET | ORAL | Status: DC | PRN

## 2014-12-23 MED ORDER — LACTATED RINGERS IV SOLN
500.0000 mL | INTRAVENOUS | Status: DC | PRN
  Administered 2014-12-23 (×3): 500 mL via INTRAVENOUS

## 2014-12-23 MED ORDER — TETANUS-DIPHTH-ACELL PERTUSSIS 5-2.5-18.5 LF-MCG/0.5 IM SUSP: 0.5000 mL | Freq: Once | INTRAMUSCULAR | Status: DC

## 2014-12-23 MED ORDER — NICOTINE 14 MG/24HR TD PT24
14.0000 mg | MEDICATED_PATCH | Freq: Every day | TRANSDERMAL | Status: DC
  Administered 2014-12-23 – 2014-12-24 (×2): 14 mg via TRANSDERMAL
  Filled 2014-12-23 (×4): qty 1

## 2014-12-23 MED ORDER — FERROUS SULFATE 325 (65 FE) MG PO TABS
325.0000 mg | ORAL_TABLET | Freq: Two times a day (BID) | ORAL | Status: DC
  Administered 2014-12-24 – 2014-12-25 (×3): 325 mg via ORAL
  Filled 2014-12-23 (×3): qty 1

## 2014-12-23 MED ORDER — SODIUM CHLORIDE 0.9 % IJ SOLN
3.0000 mL | Freq: Two times a day (BID) | INTRAMUSCULAR | Status: DC
  Administered 2014-12-23: 3 mL via INTRAVENOUS

## 2014-12-23 NOTE — Anesthesia Preprocedure Evaluation (Signed)
Anesthesia Evaluation  Patient identified by MRN, date of birth, ID band Patient awake    Reviewed: Allergy & Precautions, Patient's Chart, lab work & pertinent test results  Airway Mallampati: II  TM Distance: >3 FB Neck ROM: Full    Dental no notable dental hx. (+) Teeth Intact   Pulmonary Current Smoker,    Pulmonary exam normal breath sounds clear to auscultation       Cardiovascular negative cardio ROS Normal cardiovascular exam Rhythm:Regular Rate:Normal     Neuro/Psych negative neurological ROS  negative psych ROS   GI/Hepatic Neg liver ROS, GERD  ,  Endo/Other  negative endocrine ROS  Renal/GU negative Renal ROS  negative genitourinary   Musculoskeletal negative musculoskeletal ROS (+)   Abdominal   Peds  Hematology negative hematology ROS (+)   Anesthesia Other Findings   Reproductive/Obstetrics (+) Pregnancy                             Anesthesia Physical Anesthesia Plan  ASA: II  Anesthesia Plan: Epidural   Post-op Pain Management:    Induction:   Airway Management Planned: Natural Airway  Additional Equipment:   Intra-op Plan:   Post-operative Plan:   Informed Consent: I have reviewed the patients History and Physical, chart, labs and discussed the procedure including the risks, benefits and alternatives for the proposed anesthesia with the patient or authorized representative who has indicated his/her understanding and acceptance.     Plan Discussed with: Anesthesiologist  Anesthesia Plan Comments:         Anesthesia Quick Evaluation

## 2014-12-23 NOTE — H&P (Signed)
24 y.o. [redacted]w[redacted]d  G3P2002 comes in c/o labor.  Otherwise has good fetal movement and no bleeding.  Past Medical History  Diagnosis Date  . Ovarian cyst 2012  . HPV (human papilloma virus) infection   . Shingles     Past Surgical History  Procedure Laterality Date  . Laceration repair      OB History  Gravida Para Term Preterm AB SAB TAB Ectopic Multiple Living  3 2 2  0 0 0 0 0 0 2    # Outcome Date GA Lbr Len/2nd Weight Sex Delivery Anes PTL Lv  3 Current           2 Term      Vag-Spont     1 Term      Vag-Spont         Social History   Social History  . Marital Status: Single    Spouse Name: N/A  . Number of Children: N/A  . Years of Education: N/A   Occupational History  . Not on file.   Social History Main Topics  . Smoking status: Current Every Day Smoker -- 0.50 packs/day    Types: Cigarettes  . Smokeless tobacco: Never Used  . Alcohol Use: Yes     Comment: occ, States not during this pregnancy  . Drug Use: No  . Sexual Activity: Yes    Birth Control/ Protection: None   Other Topics Concern  . Not on file   Social History Narrative   Bee venom    Prenatal Transfer Tool  Maternal Diabetes: No Genetic Screening: Normal Maternal Ultrasounds/Referrals: Normal Fetal Ultrasounds or other Referrals:  None Maternal Substance Abuse:  Yes:  Type: Smoker Significant Maternal Medications:  None Significant Maternal Lab Results: None  Other PNC: uncomplicated.    Filed Vitals:   12/23/14 0700  BP: 131/62  Pulse: 78  Temp:   Resp:      Lungs/Cor:  NAD Abdomen:  soft, gravid Ex:  no cords, erythema SVE:  3-4/80/-2 per nurse FHTs:  140, good STV, accels but not reactive Toco:  q 3-4  BPP 6/10   A/P   Early labor with 6/10 BPP.  Induction.  GBS POS- PCN.  Derrika Ruffalo A

## 2014-12-23 NOTE — Progress Notes (Signed)
Cord ph 7.19.  Apgars still pending.

## 2014-12-23 NOTE — Consult Note (Signed)
Neonatology Note:  Attendance post delivery for poor transition:  Our team responded to a call for assistance in delivery room #67 following NSVD, due to floppy infant with apnea and cyanosis. The requesting physician was Dr. Dr Henderson CloudHorvath. The mother is a 24yo at 552w1d G3P2002, GBS pos with PCN x3, otherwise reassuring labs. ROM occurred 12 hours PTD with concern for small abruption.  At delivery, the fluid was bloody with small amount meconium.  Immediately after delivery, the baby initial was placed on mother skin with fair tone and breathes with development of subsequent distress.  Initial apgar 7.  The OB nursing staff in attendance brought infant to warmer and gave vigorous stimulation and NICU was called.  Our team arrived at ~3-4 minutes of life, at which time the baby was floppy and cyanotic.  While respiratory support was being set up, I suctioned large amount bloody clots from nose and secretions from orphyarnx.  HR 50-60 bpm.  No air movement.  PPV begun with immediate response in heart rate.  Respiratory also improved.  PPV stopped and CPAP continued. Sao2 placed and infant with sao2 mid 80s though requiring 100%.  Infant with moderate grunting and wob and need for continued high oxygen delivery. Parents updated regarding need to transfer to NICU.   Apgar at 5min was 5 and at 10min was 8 . Baby transferred to the NICU for further management.     Dineen Kidavid C. Leary RocaEhrmann, MD

## 2014-12-23 NOTE — Progress Notes (Addendum)
Called Dr. Tenny Crawoss, inform Pt is G3, P2 at 39.1, SVE - 03/13/58/-2, vxt, uc Q 2-684mins, strip non reactive.  MD wants strip to be reactive and then pt can want for an hour and recheck

## 2014-12-23 NOTE — Progress Notes (Signed)
Patient was C/C/+3 and pushed for 5 minutes with epidural.   NSVD  female infant, Apgars pending, weight P.   The patient had a very small first degree perineal laceration repaired with 2-0 vicryl. Fundus was firm. EBL was expected amount- 150cc. Placenta was delivered intact. Vagina was clear.  Baby initially moving and pink but became much less vigorous.  Baby transferred to warmer and Neo came almost immediately.  HR initially in 100s but improved after Neo cleared a blood clot obstructing baby's airway.  Now getting blow by and some Cpap- pink and moving but retracting.  Neo at bedside.  Terriona Horlacher A

## 2014-12-23 NOTE — Anesthesia Procedure Notes (Signed)
Epidural Patient location during procedure: OB Start time: 12/23/2014 9:05 AM  Staffing Anesthesiologist: Mal AmabileFOSTER, Oscar Hank Performed by: anesthesiologist   Preanesthetic Checklist Completed: patient identified, site marked, surgical consent, pre-op evaluation, timeout performed, IV checked, risks and benefits discussed and monitors and equipment checked  Epidural Patient position: sitting Prep: site prepped and draped and DuraPrep Patient monitoring: continuous pulse ox and blood pressure Approach: midline Location: L3-L4 Injection technique: LOR air  Needle:  Needle type: Tuohy  Needle gauge: 17 G Needle length: 9 cm and 9 Needle insertion depth: 5 cm cm Catheter type: closed end flexible Catheter size: 19 Gauge Catheter at skin depth: 10 cm Test dose: negative and Other  Assessment Events: blood not aspirated, injection not painful, no injection resistance, negative IV test and no paresthesia  Additional Notes Patient identified. Risks and benefits discussed including failed block, incomplete  Pain control, post dural puncture headache, nerve damage, paralysis, blood pressure Changes, nausea, vomiting, reactions to medications-both toxic and allergic and post Partum back pain. All questions were answered. Patient expressed understanding and wished to proceed. Sterile technique was used throughout procedure. Epidural site was Dressed with sterile barrier dressing. No paresthesias, signs of intravascular injection Or signs of intrathecal spread were encountered.  Patient was more comfortable after the epidural was dosed. Please see RN's note for documentation of vital signs and FHR which are stable.

## 2014-12-24 ENCOUNTER — Encounter (HOSPITAL_COMMUNITY): Payer: Self-pay | Admitting: *Deleted

## 2014-12-24 LAB — CBC
HEMATOCRIT: 28.7 % — AB (ref 36.0–46.0)
Hemoglobin: 9.7 g/dL — ABNORMAL LOW (ref 12.0–15.0)
MCH: 31.3 pg (ref 26.0–34.0)
MCHC: 33.8 g/dL (ref 30.0–36.0)
MCV: 92.6 fL (ref 78.0–100.0)
Platelets: 236 10*3/uL (ref 150–400)
RBC: 3.1 MIL/uL — ABNORMAL LOW (ref 3.87–5.11)
RDW: 13.7 % (ref 11.5–15.5)
WBC: 16.1 10*3/uL — AB (ref 4.0–10.5)

## 2014-12-24 NOTE — Lactation Note (Signed)
This note was copied from the chart of [name]. Lactation Consultation Note  Patient Name: [name]'s Date: 12/24/2014 Reason for consult: Initial assessment;NICU baby NICU baby, 2924 hours old. Mom states that she has pumped once today and obtained about 10 ml. Discussed EBM storage guidelines, and enc mom to take milk with her when she goes to NICU. Enc mom to pump 8 times/24 hours for 15 minutes followed by hand expression. Discuss supply and demand. Mom given supplies, NICU booklet, and LC brochure with review/instructions. Mom aware of OP/BFSG and LC phone line assistance after D/C. Mom states that she hasn't been active with WIC, but was told at some point that they would work with her. [facility] and [facility] was [facility]ed [facility]. Mom aware of 2-week pump rental as well.   Maternal Data Has patient been taught Hand Expression?: Yes (per mom.) Does the patient have breastfeeding experience prior to this delivery?: Yes  Feeding    LATCH Score/Interventions                      Lactation Tools Discussed/Used WIC Program: Yes Pump Review: Setup, frequency, and cleaning;Milk Storage Initiated by:: bedside RN Date initiated:: 12/23/14   Consult Status Consult Status: Follow-up Date: 12/25/14 Follow-up type: In-patient    Geralynn OchsWILLIARD, Rene Sizelove 12/24/2014, 4:25 PM

## 2014-12-24 NOTE — Anesthesia Postprocedure Evaluation (Signed)
  Anesthesia Post-op Note  Patient: [name]  Procedure(s) Performed: * No procedures listed *  Patient Location: Women's Unit  Anesthesia Type:Epidural  Level of Consciousness: awake, alert  and oriented  Airway and Oxygen Therapy: Patient Spontanous Breathing  Post-op Pain: none  Post-op Assessment: Post-op Vital signs reviewed, Patient's Cardiovascular Status Stable, Respiratory Function Stable, Patent Airway, No signs of Nausea or vomiting, Adequate PO intake, Pain level controlled, No headache, No backache and Patient able to bend at knees              Post-op Vital Signs: Reviewed and stable  Last Vitals:  Filed Vitals:   12/23/14 2335  BP: 111/65  Pulse: 76  Temp: 36.8 C  Resp: 18    Complications: No apparent anesthesia complications

## 2014-12-24 NOTE — Progress Notes (Signed)
PPD#1 Pt without complaints. Lochia wnl. Baby in NICU VSSAF IMP/ Stable Plan/Routine care.

## 2014-12-24 NOTE — Progress Notes (Signed)
Admission nutrition screen triggered for unintentional weight loss > 10 lbs within the last month. PNR indicates 37 lb weight gain  Patients chart reviewed and assessed  for nutritional risk. Patient is determined to be at low nutrition  risk.   Elisabeth CaraKatherine Miko Sirico M.Odis LusterEd. R.D. LDN Neonatal Nutrition Support Specialist/RD III Pager [phone]      Phone [phone]

## 2014-12-25 MED ORDER — IBUPROFEN 800 MG PO TABS
800.0000 mg | ORAL_TABLET | Freq: Three times a day (TID) | ORAL | Status: DC
Start: 2014-12-25 — End: 2015-03-13

## 2014-12-25 MED ORDER — OXYCODONE-ACETAMINOPHEN 5-325 MG PO TABS: 1.0000 | ORAL_TABLET | ORAL | Status: DC | PRN

## 2014-12-25 MED ORDER — POLYETHYLENE GLYCOL 3350 17 G PO PACK: 17.0000 g | PACK | Freq: Every day | ORAL | Status: DC

## 2014-12-25 NOTE — Discharge Summary (Signed)
Obstetric Discharge Summary Reason for Admission: onset of labor Prenatal Procedures: none Intrapartum Procedures: spontaneous vaginal delivery Postpartum Procedures: none Complications-Operative and Postpartum: 1 degree perineal laceration HEMOGLOBIN  Date Value Ref Range Status  12/24/2014 9.7* 12.0 - 15.0 g/dL Final    Comment:    DELTA CHECK NOTED REPEATED TO VERIFY    HCT  Date Value Ref Range Status  12/24/2014 28.7* 36.0 - 46.0 % Final    Physical Exam:  General: alert and cooperative Lochia: appropriate Uterine Fundus: firm DVT Evaluation: No evidence of DVT seen on physical exam.  Discharge Diagnoses: Term Pregnancy-delivered  Discharge Information: Date: 12/25/2014 Activity: pelvic rest Diet: routine Medications: PNV, Ibuprofen and Percocet Condition: stable Instructions: refer to practice specific booklet Discharge to: home Follow-up Information    Follow up with HORVATH,MICHELLE A, MD In 4 weeks.   Specialty:  Obstetrics and Gynecology   Contact information:   [address] [place] [id number] [phone]       Newborn Data: Live born female  Birth Weight: 7 lb 5.8 oz (3340 g) APGAR: 7, 5  In  NICU w improving respiratory distress.  [facility] Memorial HospitalDYANNA GEFFEL Julieann Drummonds 12/25/2014, 8:38 AM

## 2014-12-25 NOTE — Progress Notes (Signed)
Pt is discharged in the care of husband,with N.T. Escort. Denies any pain or discomfort. Spirits are good. Discharged equipmentinstructions with Rx were given to pt Questions asked and answered. Infant to remain Nicu. Denies vaginal bleeding .No equipment  Needed for home use.

## 2014-12-25 NOTE — Lactation Note (Signed)
This note was copied from the chart of Boy Freida BusmanDonna Bogle. Lactation Consultation Note  Patient Name: Boy Freida BusmanDonna Mihalic [id number]'YToday's Date: 12/25/2014 Reason for consult: Follow-up assessment;NICU baby  NICU baby 2818 hours old. Mom reports that she already pumped an ounce of EBM this morning. Enc mom to pump 8 times/24 hours for 15 minutes followed by hand expression. Mom states that she is visiting baby often. Enc mom to provide STS and nuzzling/latching at breast. [facility] Mom aware of OP/BFSG and LC phone line assistance after D/C.   Maternal Data    Feeding Feeding Type: Breast Milk Nipple Type: Slow - flow Length of feed: 20 min  LATCH Score/Interventions                      Lactation Tools Discussed/Used     Consult Status Consult Status: PRN    Geralynn OchsWILLIARD, Waldine Zenz 12/25/2014, 10:02 AM

## 2014-12-25 NOTE — Discharge Instructions (Signed)
°
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
## 2014-12-29 ENCOUNTER — Ambulatory Visit: Payer: Self-pay

## 2014-12-29 NOTE — Lactation Note (Signed)
This note was copied from the chart of Donna Freida BusmanDonna Gallogly. Lactation Consultation Note  Mother is reporting sore nipples. The skin on her rt nipple is cracked.  Reviewed pumping with her and instructed her on correct flange positioning. Explained how to increase suction on pump with out causing nipple trauma.  She is expressing every 4 hours and her breasts are very firm.  Discussed pumping schedule.  At this session she expressed over 250 ml.  Encouraged support groups.  Comfort gels and expressed milk to nipples.  Patient Name: [name] FAOZH'YToday's Date: 12/29/2014     Maternal Data    Feeding    LATCH Score/Interventions                      Lactation Tools Discussed/Used     Consult Status      [name], [name] 12/29/2014, 1:56 PM

## 2014-12-31 ENCOUNTER — Inpatient Hospital Stay (HOSPITAL_COMMUNITY)
Admission: AD | Admit: 2014-12-31 | Discharge: 2014-12-31 | Disposition: A | Discharge status: Home / Self Care | Payer: Medicaid Other | Source: Ambulatory Visit | Attending: Obstetrics and Gynecology | Admitting: Obstetrics and Gynecology

## 2014-12-31 NOTE — MAU Note (Signed)
Dr office had called, pt left and went to office

## 2014-12-31 NOTE — MAU Note (Signed)
Not in lobby#1 

## 2015-03-06 ENCOUNTER — Other Ambulatory Visit: Payer: Self-pay | Admitting: Obstetrics and Gynecology

## 2015-03-07 LAB — CYTOLOGY - PAP

## 2015-03-13 ENCOUNTER — Inpatient Hospital Stay (HOSPITAL_COMMUNITY)
Admission: AD | Admit: 2015-03-13 | Discharge: 2015-03-13 | Disposition: A | Discharge status: Home / Self Care | Payer: Medicaid Other | Source: Ambulatory Visit | Attending: Obstetrics and Gynecology | Admitting: Obstetrics and Gynecology

## 2015-03-13 ENCOUNTER — Encounter (HOSPITAL_COMMUNITY): Payer: Self-pay

## 2015-03-13 DIAGNOSIS — T839XXA Unspecified complication of genitourinary prosthetic device, implant and graft, initial encounter: Secondary | ICD-10-CM | POA: Diagnosis present

## 2015-03-13 DIAGNOSIS — R102 Pelvic and perineal pain: Secondary | ICD-10-CM | POA: Diagnosis present

## 2015-03-13 DIAGNOSIS — F17210 Nicotine dependence, cigarettes, uncomplicated: Secondary | ICD-10-CM | POA: Insufficient documentation

## 2015-03-13 DIAGNOSIS — M545 Low back pain: Secondary | ICD-10-CM | POA: Diagnosis present

## 2015-03-13 DIAGNOSIS — Z30431 Encounter for routine checking of intrauterine contraceptive device: Secondary | ICD-10-CM | POA: Insufficient documentation

## 2015-03-13 LAB — URINE MICROSCOPIC-ADD ON

## 2015-03-13 LAB — URINALYSIS, ROUTINE W REFLEX MICROSCOPIC
BILIRUBIN URINE: NEGATIVE
Glucose, UA: NEGATIVE mg/dL
Ketones, ur: NEGATIVE mg/dL
Leukocytes, UA: NEGATIVE
Nitrite: NEGATIVE
PH: 6 (ref 5.0–8.0)
Protein, ur: NEGATIVE mg/dL
SPECIFIC GRAVITY, URINE: 1.01 (ref 1.005–1.030)

## 2015-03-13 LAB — POCT PREGNANCY, URINE: Preg Test, Ur: NEGATIVE

## 2015-03-13 MED ORDER — OXYCODONE-ACETAMINOPHEN 5-325 MG PO TABS: 1.0000 | ORAL_TABLET | Freq: Four times a day (QID) | ORAL | Status: DC | PRN

## 2015-03-13 MED ORDER — IBUPROFEN 600 MG PO TABS: 600.0000 mg | ORAL_TABLET | Freq: Four times a day (QID) | ORAL | Status: DC | PRN

## 2015-03-13 NOTE — MAU Provider Note (Signed)
Chief Complaint:  Abdominal Pain   First Provider Initiated Contact with Patient 03/13/15 1236     Primary MD:  Henderson Cloud  HPI [name] is a 25 y.o. [id number] who presents to maternity admissions reporting severe pelvic and low back pain since getting her Mirena IUD last week.  Has been taking ibuprofen  without relief. She reports no vaginal bleeding, vaginal itching/burning, urinary symptoms, h/a, dizziness, n/v, or fever/chills.    RN Note:  Expand All Collapse All   Pt presents to MAU with complaints of pain in her lower back and abdomen. Had IUD placed by Dr Henderson Cloud on January the 26th. Pt delivered on 12/23/14 vaginally.         Past Medical History: Past Medical History  Diagnosis Date  . Ovarian cyst 2012  . HPV (human papilloma virus) infection   . Shingles     Past obstetric history: OB History  Gravida Para Term Preterm AB SAB TAB Ectopic Multiple Living  0 0 0 0 0 0 3    # Outcome Date GA Lbr Len/2nd Weight Sex Delivery Anes PTL Lv  3 Term 12/23/14 [redacted]w[redacted]d 22:13 / 00:07 7 lb 5.8 oz (3.34 kg) M Vag-Spont EPI  Y  2 Term      Vag-Spont     1 Term      Vag-Spont         Past Surgical History: Past Surgical History  Procedure Laterality Date  . Laceration repair      Family History: Family History  Problem Relation Age of Onset  . Cancer Mother   . Cancer Maternal Aunt     Social History: Social History  Substance Use Topics  . Smoking status: Current Every Day Smoker -- 0.50 packs/day    Types: Cigarettes  . Smokeless tobacco: Never Used  . Alcohol Use: Yes     Comment: occ, States not during this pregnancy    Allergies:  Allergies  Allergen Reactions  . Bee Venom Anaphylaxis, Shortness Of Breath and Swelling    Meds:  Prescriptions prior to admission  Medication Sig Dispense Refill Last Dose  . calcium carbonate (TUMS - DOSED IN MG ELEMENTAL CALCIUM) 500 MG chewable tablet Chew 1 tablet by mouth daily as needed for indigestion  or heartburn.   12/23/2014 at Unknown time  . EPINEPHrine 0.3 mg/0.3 mL IJ SOAJ injection Inject 0.3 mg into the muscle once.   More than a month at Unknown time  . ibuprofen (ADVIL,MOTRIN) 800 MG tablet Take 1 tablet (800 mg total) by mouth every 8 (eight) hours. 40 tablet 0   . oxyCODONE-acetaminophen (PERCOCET/ROXICET) 5-325 MG tablet Take 1 tablet by mouth every 4 (four) hours as needed (for pain scale 4-7). 10 tablet 0   . polyethylene glycol (MIRALAX) packet Take 17 g by mouth daily. 14 each 2   . Prenatal Vit-Fe Fumarate-FA (PRENATAL MULTIVITAMIN) TABS tablet Take 1 tablet by mouth daily at 12 noon.   Past Week at Unknown time    I have reviewed patient's Past Medical Hx, Surgical Hx, Family Hx, Social Hx, medications and allergies.  ROS:  Review of Systems  Constitutional: Negative for fever, chills and fatigue.  Gastrointestinal: Positive for abdominal pain. Negative for nausea, vomiting, diarrhea and constipation.  Genitourinary: Positive for pelvic pain. Negative for flank pain, vaginal bleeding, vaginal discharge and difficulty urinating.  Musculoskeletal: Negative for myalgias.   Other systems negative  Physical Exam  Patient Vitals for the past 24 hrs:  BP Temp Pulse Resp  03/13/15 1223 111/68 mmHg 98 F (36.7 C) 91 18   Constitutional: Well-developed, well-nourished female in no acute distress.  Cardiovascular: normal rate and rhythm, no ectopy audible, S1 & S2 heard, no murmur Respiratory: normal effort, no distress. Lungs CTAB with no wheezes or crackles GI: Abd soft, mildly tender, over pelvic area.  No focal tenderness, negative McBurney.  Nondistended.  No rebound, No guarding.  Bowel Sounds audible  MS: Extremities nontender, no edema, normal ROM Neurologic: Alert and oriented x 4.   Grossly nonfocal. GU: Neg CVAT. Skin:  Warm and Dry Psych:  Affect appropriate. PELVIC EXAM: deferred per pt request Patient declines STD testing, states has not had intercourse  since delivery.   Labs: Results for orders placed or performed during the hospital encounter of 03/13/15 (from the past 24 hour(s))  Pregnancy, urine POC     Status: None   Collection Time: 03/13/15 12:33 PM  Result Value Ref Range   Preg Test, Ur NEGATIVE NEGATIVE   --/--/O POS, O POS (11/14 0240) Results for orders placed or performed during the hospital encounter of 03/13/15 (from the past 24 hour(s))  Urinalysis, Routine w reflex microscopic (not at [facility])     Status: Abnormal   Collection Time: 03/13/15 12:25 PM  Result Value Ref Range   Color, Urine YELLOW YELLOW   APPearance CLEAR CLEAR   Specific Gravity, Urine 1.010 1.005 - 1.030   pH 6.0 5.0 - 8.0   Glucose, UA NEGATIVE NEGATIVE mg/dL   Hgb urine dipstick TRACE (A) NEGATIVE   Bilirubin Urine NEGATIVE NEGATIVE   Ketones, ur NEGATIVE NEGATIVE mg/dL   Protein, ur NEGATIVE NEGATIVE mg/dL   Nitrite NEGATIVE NEGATIVE   Leukocytes, UA NEGATIVE NEGATIVE  Urine microscopic-add on     Status: Abnormal   Collection Time: 03/13/15 12:25 PM  Result Value Ref Range   Squamous Epithelial / LPF 0-5 (A) NONE SEEN   WBC, UA 0-5 0 - 5 WBC/hpf   RBC / HPF 0-5 0 - 5 RBC/hpf   Bacteria, UA RARE (A) NONE SEEN  Pregnancy, urine POC     Status: None   Collection Time: 03/13/15 12:33 PM  Result Value Ref Range   Preg Test, Ur NEGATIVE NEGATIVE    Imaging:  No results found.  MAU Course/MDM: I have ordered labs as follows: UA (normal) Imaging ordered:  Results reviewed.   Consult Dr Dareen Piano with presentation and complaints.  He states we can either offer removal here or supportive care with followup in office by Dr Henderson Cloud. Treatments in MAU:   none   Pt stable at time of discharge.  Assessment: Pelvic pain, probably due to uterine spasm with IUD  Plan: Discharge home Discussed this might be a normal uterine crampiness associated with new placement of IUD. Offered option of removal but patient prefers to try some pain meds with  reevaluation in 1-2 weeks. If still having pain, will go to office for removal.  Has an appt first of March for Pap/Colpo. Recommend ibuprofen on 6 hr schedule. May supplement judiciously with Percocet, but I will only prescribe #20 tabs with no refill. She states she will use it sparingly, worried because her father died recently of drug overdose.  She does not want to get addicted. I advised against using them longer than 2 weeks.  Rx sent for Ibuprofen 600mg  q 6 hrs for pain Rx given for Percocet 1 PO q4 hrs prn #20.      Medication  List    ASK your doctor about these medications        calcium carbonate 500 MG chewable tablet  Commonly known as:  TUMS - dosed in mg elemental calcium  Chew 1 tablet by mouth daily as needed for indigestion or heartburn.     EPINEPHrine 0.3 mg/0.3 mL Soaj injection  Commonly known as:  EPI-PEN  Inject 0.3 mg into the muscle once.     ibuprofen 800 MG tablet  Commonly known as:  ADVIL,MOTRIN  Take 1 tablet (800 mg total) by mouth every 8 (eight) hours.     oxyCODONE-acetaminophen 5-325 MG tablet  Commonly known as:  PERCOCET/ROXICET  Take 1 tablet by mouth every 4 (four) hours as needed (for pain scale 4-7).     polyethylene glycol packet  Commonly known as:  MIRALAX  Take 17 g by mouth daily.     prenatal multivitamin Tabs tablet  Take 1 tablet by mouth daily at 12 noon.       Encouraged to return here or to other Urgent Care/ED if she develops worsening of symptoms, increase in pain, fever, or other concerning symptoms.   Wynelle Bourgeois CNM, MSN Certified Nurse-Midwife 03/13/2015 12:36 PM

## 2015-03-13 NOTE — Discharge Instructions (Signed)
Levonorgestrel intrauterine device (IUD) What is this medicine? LEVONORGESTREL IUD (LEE voe nor jes trel) is a contraceptive (birth control) device. The device is placed inside the uterus by a healthcare professional. It is used to prevent pregnancy and can also be used to treat heavy bleeding that occurs during your period. Depending on the device, it can be used for 3 to 5 years. This medicine may be used for other purposes; ask your health care provider or pharmacist if you have questions. What should I tell my health care provider before I take this medicine? They need to know if you have any of these conditions: -abnormal Pap smear -cancer of the breast, uterus, or cervix -diabetes -endometritis -genital or pelvic infection now or in the past -have more than one sexual partner or your partner has more than one partner -heart disease -history of an ectopic or tubal pregnancy -immune system problems -IUD in place -liver disease or tumor -problems with blood clots or take blood-thinners -use intravenous drugs -uterus of unusual shape -vaginal bleeding that has not been explained -an unusual or allergic reaction to levonorgestrel, other hormones, silicone, or polyethylene, medicines, foods, dyes, or preservatives -pregnant or trying to get pregnant -breast-feeding How should I use this medicine? This device is placed inside the uterus by a health care professional. Talk to your pediatrician regarding the use of this medicine in children. Special care may be needed. Overdosage: If you think you have taken too much of this medicine contact a poison control center or emergency room at once. NOTE: This medicine is only for you. Do not share this medicine with others. What if I miss a dose? This does not apply. What may interact with this medicine? Do not take this medicine with any of the following medications: -amprenavir -bosentan -fosamprenavir This medicine may also interact with  the following medications: -aprepitant -barbiturate medicines for inducing sleep or treating seizures -bexarotene -griseofulvin -medicines to treat seizures like carbamazepine, ethotoin, felbamate, oxcarbazepine, phenytoin, topiramate -modafinil -pioglitazone -rifabutin -rifampin -rifapentine -some medicines to treat HIV infection like atazanavir, indinavir, lopinavir, nelfinavir, tipranavir, ritonavir -St. John's wort -warfarin This list may not describe all possible interactions. Give your health care provider a list of all the medicines, herbs, non-prescription drugs, or dietary supplements you use. Also tell them if you smoke, drink alcohol, or use illegal drugs. Some items may interact with your medicine. What should I watch for while using this medicine? Visit your doctor or health care professional for regular check ups. See your doctor if you or your partner has sexual contact with others, becomes HIV positive, or gets a sexual transmitted disease. This product does not protect you against HIV infection (AIDS) or other sexually transmitted diseases. You can check the placement of the IUD yourself by reaching up to the top of your vagina with clean fingers to feel the threads. Do not pull on the threads. It is a good habit to check placement after each menstrual period. Call your doctor right away if you feel more of the IUD than just the threads or if you cannot feel the threads at all. The IUD may come out by itself. You may become pregnant if the device comes out. If you notice that the IUD has come out use a backup birth control method like condoms and call your health care provider. Using tampons will not change the position of the IUD and are okay to use during your period. What side effects may I notice from receiving this medicine?   Side effects that you should report to your doctor or health care professional as soon as possible: -allergic reactions like skin rash, itching or  hives, swelling of the face, lips, or tongue -fever, flu-like symptoms -genital sores -high blood pressure -no menstrual period for 6 weeks during use -pain, swelling, warmth in the leg -pelvic pain or tenderness -severe or sudden headache -signs of pregnancy -stomach cramping -sudden shortness of breath -trouble with balance, talking, or walking -unusual vaginal bleeding, discharge -yellowing of the eyes or skin Side effects that usually do not require medical attention (report to your doctor or health care professional if they continue or are bothersome): -acne -breast pain -change in sex drive or performance -changes in weight -cramping, dizziness, or faintness while the device is being inserted -headache -irregular menstrual bleeding within first 3 to 6 months of use -nausea This list may not describe all possible side effects. Call your doctor for medical advice about side effects. You may report side effects to FDA at 1-800-FDA-1088. Where should I keep my medicine? This does not apply. NOTE: This sheet is a summary. It may not cover all possible information. If you have questions about this medicine, talk to your doctor, pharmacist, or health care provider.    2016, Elsevier/Gold Standard. (2011-02-25 13:54:04)  

## 2015-03-13 NOTE — MAU Note (Signed)
Pt presents to MAU with complaints of pain in her lower back and abdomen. Had IUD placed by Dr Henderson Cloud on January the 26th. Pt delivered on 12/23/14 vaginally.

## 2015-04-09 ENCOUNTER — Other Ambulatory Visit: Payer: Self-pay | Admitting: Obstetrics and Gynecology

## 2015-04-16 ENCOUNTER — Inpatient Hospital Stay (HOSPITAL_COMMUNITY)
Admission: AD | Admit: 2015-04-16 | Discharge: 2015-04-16 | Disposition: A | Discharge status: Home / Self Care | Payer: Medicaid Other | Source: Ambulatory Visit | Attending: Obstetrics and Gynecology | Admitting: Obstetrics and Gynecology

## 2015-04-16 ENCOUNTER — Encounter (HOSPITAL_COMMUNITY): Payer: Self-pay | Admitting: *Deleted

## 2015-04-16 DIAGNOSIS — Z3202 Encounter for pregnancy test, result negative: Secondary | ICD-10-CM | POA: Insufficient documentation

## 2015-04-16 DIAGNOSIS — F17210 Nicotine dependence, cigarettes, uncomplicated: Secondary | ICD-10-CM | POA: Insufficient documentation

## 2015-04-16 DIAGNOSIS — R109 Unspecified abdominal pain: Secondary | ICD-10-CM | POA: Diagnosis not present

## 2015-04-16 DIAGNOSIS — Z9889 Other specified postprocedural states: Secondary | ICD-10-CM

## 2015-04-16 HISTORY — DX: Unspecified abnormal cytological findings in specimens from vagina: R87.629

## 2015-04-16 LAB — URINALYSIS, ROUTINE W REFLEX MICROSCOPIC
BILIRUBIN URINE: NEGATIVE
GLUCOSE, UA: NEGATIVE mg/dL
Hgb urine dipstick: NEGATIVE
KETONES UR: 15 mg/dL — AB
NITRITE: NEGATIVE
PROTEIN: NEGATIVE mg/dL
Specific Gravity, Urine: 1.02 (ref 1.005–1.030)
pH: 6 (ref 5.0–8.0)

## 2015-04-16 LAB — POCT PREGNANCY, URINE: Preg Test, Ur: NEGATIVE

## 2015-04-16 LAB — URINE MICROSCOPIC-ADD ON: RBC / HPF: NONE SEEN RBC/hpf (ref 0–5)

## 2015-04-16 MED ORDER — IBUPROFEN 600 MG PO TABS
600.0000 mg | ORAL_TABLET | Freq: Once | ORAL | Status: AC
  Administered 2015-04-16: 600 mg via ORAL
  Filled 2015-04-16: qty 1

## 2015-04-16 NOTE — MAU Note (Signed)
Pt given discharge info from Estanislado SpireE. Lawrence, NP.  Pt refused to let RN take VS prior to leaving.  Pt states her pain is" still a fucking 10".  Pt states she needed to leave to take care of her crying child.  Signed esignature pad.

## 2015-04-16 NOTE — Discharge Instructions (Signed)
Cervical Biopsy, Care After Refer to this sheet in the next few weeks. These instructions provide you with information about caring for yourself after your procedure. Your health care provider may also give you more specific instructions. Your treatment has been planned according to current medical practices, but problems sometimes occur. Call your health care provider if you have any problems or questions after your procedure. WHAT TO EXPECT AFTER THE PROCEDURE After your procedure, it is common to have:   Cramping or mild pain for a few days.  Slight bleeding from the vagina for a few days.  Dark-colored vaginal discharge for a few days. HOME CARE INSTRUCTIONS  Take over-the-counter and prescription medicines only as told by your health care provider.  Return to your normal activities as told by your health care provider. Ask your health care provider what activities are safe for you.  Use a sanitary napkin until bleeding and discharge stop.  Do not use tampons until your health care provider approves.  Do not douche until your health care provider approves.  Do not have sex until your health care provider approves.  Keep all follow-up visits as told by your health care provider. This is important. SEEK MEDICAL CARE IF:   You have a fever or chills.  You have bad-smelling vaginal discharge.  You have itching or irritation around the vagina.  You have lower abdominal pain. SEEK IMMEDIATE MEDICAL CARE IF:   You develop heavy vaginal bleeding that soaks more than one sanitary pad an hour.  You faint.  You have very bad lower abdominal pain.   This information is not intended to replace advice given to you by your health care provider. Make sure you discuss any questions you have with your health care provider.   Document Released: 10/16/2014 Document Reviewed: 06/12/2014 Elsevier Interactive Patient Education 2016 Elsevier Inc.  

## 2015-04-16 NOTE — MAU Note (Signed)
Had a biopsy of her cervix on 04-09-15 and she has increased cramping; had abnormal pap and is to she her gyn on 05-01-15;

## 2015-04-16 NOTE — MAU Note (Signed)
Pt presents to MAU with complaints of lower back pain and lower abdominal cramping. PT states she had a colposcopy on March the 1st for abnormal cells and has been hurting since. Has an appointment on March 23rd for consultation for results

## 2015-04-16 NOTE — MAU Provider Note (Signed)
History     CSN: [id number]  Arrival date and time: 04/16/15 1312   First Provider Initiated Contact with Patient 04/16/15 1420         Chief Complaint  Patient presents with  . Abdominal Pain  . Back Pain   HPI CHAD TIZNADO is a 25 y.o. female who presents for lower abdominal cramping. States symptoms started immediately after cervical biopsy on 3/1 (by Dr. Henderson Cloud). Reports pain 10/10 & cramp like. Has not treated pain & has not called office about pain.  Denies vaginal bleeding, vaginal discharge, or fever.   OB History    Gravida Para Term Preterm AB TAB SAB Ectopic Multiple Living   0 0 0 0 0 0 3      Past Medical History  Diagnosis Date  . Ovarian cyst 2012  . HPV (human papilloma virus) infection   . Shingles   . Vaginal Pap smear, abnormal     Past Surgical History  Procedure Laterality Date  . Laceration repair    . Cervical biopsy      Family History  Problem Relation Age of Onset  . Cancer Mother   . Cancer Maternal Aunt     Social History  Substance Use Topics  . Smoking status: Current Every Day Smoker -- 0.50 packs/day    Types: Cigarettes  . Smokeless tobacco: Never Used  . Alcohol Use: Yes     Comment: occ, States not during this pregnancy    Allergies:  Allergies  Allergen Reactions  . Bee Venom Anaphylaxis, Shortness Of Breath and Swelling    Prescriptions prior to admission  Medication Sig Dispense Refill Last Dose  . acetaminophen (TYLENOL) 500 MG tablet Take 1,000 mg by mouth every 6 (six) hours as needed for moderate pain.   04/15/2015 at Unknown time  . ibuprofen (ADVIL,MOTRIN) 600 MG tablet Take 1 tablet (600 mg total) by mouth every 6 (six) hours as needed. 30 tablet 0 04/15/2015 at Unknown time  . EPINEPHrine 0.3 mg/0.3 mL IJ SOAJ injection Inject 0.3 mg into the muscle once.   More than a month at Unknown time  . oxyCODONE-acetaminophen (PERCOCET/ROXICET) 5-325 MG tablet Take 1-2 tablets by mouth every 6 (six) hours as  needed. (Patient not taking: Reported on 04/16/2015) 20 tablet 0 Completed Course at Unknown time  . polyethylene glycol (MIRALAX) packet Take 17 g by mouth daily. (Patient not taking: Reported on 03/13/2015) 14 each 2     Review of Systems  Constitutional: Negative.  Negative for fever.  Gastrointestinal: Positive for abdominal pain. Negative for vomiting, diarrhea and constipation.  Genitourinary: Negative.   Musculoskeletal: Positive for back pain.   Physical Exam   Blood pressure 115/63, pulse 88, temperature 98.2 F (36.8 C), resp. rate 18, last menstrual period 04/09/2015, unknown if currently breastfeeding.  Physical Exam  Nursing note and vitals reviewed. Constitutional: She is oriented to person, place, and time. She appears well-developed and well-nourished. No distress.  HENT:  Head: Normocephalic and atraumatic.  Eyes: Conjunctivae are normal. Right eye exhibits no discharge. Left eye exhibits no discharge. No scleral icterus.  Neck: Normal range of motion.  Cardiovascular: Normal rate, regular rhythm and normal heart sounds.   No murmur heard. Respiratory: Effort normal. No respiratory distress.  GI: Soft. She exhibits no distension. There is no tenderness.  Neurological: She is alert and oriented to person, place, and time.  Skin: Skin is warm and dry. She is not diaphoretic.  Psychiatric: She has  a normal mood and affect. Her behavior is normal. Judgment and thought content normal.    MAU Course  Procedures Results for orders placed or performed during the hospital encounter of 04/16/15 (from the past 24 hour(s))  Urinalysis, Routine w reflex microscopic (not [facility])     Status: Abnormal   Collection Time: 04/16/15  1:20 PM  Result Value Ref Range   Color, Urine YELLOW YELLOW   APPearance HAZY (A) CLEAR   Specific Gravity, Urine 1.020 1.005 - 1.030   pH 6.0 5.0 - 8.0   Glucose, UA NEGATIVE NEGATIVE mg/dL   Hgb urine dipstick NEGATIVE NEGATIVE   Bilirubin Urine  NEGATIVE NEGATIVE   Ketones, ur 15 (A) NEGATIVE mg/dL   Protein, ur NEGATIVE NEGATIVE mg/dL   Nitrite NEGATIVE NEGATIVE   Leukocytes, UA TRACE (A) NEGATIVE  Urine microscopic-add on     Status: Abnormal   Collection Time: 04/16/15  1:20 PM  Result Value Ref Range   Squamous Epithelial / LPF 6-30 (A) NONE SEEN   WBC, UA 0-5 0 - 5 WBC/hpf   RBC / HPF NONE SEEN 0 - 5 RBC/hpf   Bacteria, UA MANY (A) NONE SEEN  Pregnancy, urine POC     Status: None   Collection Time: 04/16/15  1:32 PM  Result Value Ref Range   Preg Test, Ur NEGATIVE NEGATIVE    MDM UPT negative Pt decline toradol Ibuprofen 600 mg PO S/w Dr. Tenny Crawoss. Discharge patient; should take otc meds & call office.  Assessment and Plan  A: 1. Abdominal cramping   2. H/O cervical biopsy     P: Discharge home Take ibuprofen or tylenol otc per bottle instructions Call office for f/u appointment & to discuss continued pain from procedure Discussed reasons to return  Judeth Hornrin Sekai Nayak 04/16/2015, 2:20 PM

## 2015-05-01 ENCOUNTER — Encounter (HOSPITAL_COMMUNITY): Payer: Self-pay | Admitting: *Deleted

## 2015-05-09 ENCOUNTER — Other Ambulatory Visit: Payer: Self-pay | Admitting: Obstetrics and Gynecology

## 2015-05-09 NOTE — H&P (Signed)
25 y.o. 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  Past Medical History  Diagnosis Date  . Ovarian cyst 2012  . HPV (human papilloma virus) infection   . Shingles   . Vaginal Pap smear, abnormal    Past Surgical History  Procedure Laterality Date  . Laceration repair      neck  . Cervical biopsy      Social History   Social History  . Marital Status: Single    Spouse Name: N/A  . Number of Children: N/A  . Years of Education: N/A   Occupational History  . Not on file.   Social History Main Topics  . Smoking status: Current Every Day Smoker -- 0.50 packs/day for 10 years    Types: Cigarettes  . Smokeless tobacco: Never Used  . Alcohol Use: 1.8 oz/week    3 Cans of beer per week     Comment: socially beer  . Drug Use: No  . Sexual Activity: Yes    Birth Control/ Protection: IUD     Comment: Mirena IUD inserted 03/2015   Other Topics Concern  . Not on file   Social History Narrative    No current facility-administered medications on file prior to encounter.   Current Outpatient Prescriptions on File Prior to Encounter  Medication Sig Dispense Refill  . acetaminophen (TYLENOL) 500 MG tablet Take 1,000 mg by mouth every 6 (six) hours as needed for moderate pain.    Marland Kitchen. EPINEPHrine 0.3 mg/0.3 mL IJ SOAJ injection Inject 0.3 mg into the muscle once.    Marland Kitchen. ibuprofen (ADVIL,MOTRIN) 600 MG tablet Take 1 tablet (600 mg total) by mouth every 6 (six) hours as needed. 30 tablet 0    Allergies  Allergen Reactions  . Bee Venom Anaphylaxis, Shortness Of Breath and Swelling    @VITALS2 @  Lungs: clear to ascultation Cor:  RRR Abdomen:  soft, nontender, nondistended. 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  D/w pt all options, risks, benefits and alternatives and she desires definitive treatment with leep.  Pt does not want BTL but  uncertain about future fertility; d/w pt leep cone may increase risk of preterm labor in future pregnancy.  Pt was bearly able to tolerate colposcopy in office and desires anesthesia for this procedure.   P: All risks, benefits and alternatives d/w patient and she desires to proceed.  SCDs during the operation.     Ryelle Ruvalcaba A

## 2015-05-12 NOTE — Anesthesia Preprocedure Evaluation (Addendum)
Anesthesia Evaluation  Patient identified by MRN, date of birth, ID band Patient awake    Reviewed: Allergy & Precautions, H&P , NPO status , Patient's Chart, lab work & pertinent test results  Airway Mallampati: II  TM Distance: >3 FB Neck ROM: full    Dental no notable dental hx. (+) Dental Advisory Given, Teeth Intact   Pulmonary Current Smoker,    Pulmonary exam normal breath sounds clear to auscultation       Cardiovascular Exercise Tolerance: Good negative cardio ROS Normal cardiovascular exam Rhythm:regular Rate:Normal     Neuro/Psych negative neurological ROS  negative psych ROS   GI/Hepatic negative GI ROS, Neg liver ROS,   Endo/Other  negative endocrine ROS  Renal/GU negative Renal ROS  negative genitourinary   Musculoskeletal   Abdominal   Peds  Hematology negative hematology ROS (+)   Anesthesia Other Findings   Reproductive/Obstetrics negative OB ROS                            Anesthesia Physical Anesthesia Plan  ASA: II  Anesthesia Plan: General   Post-op Pain Management:    Induction: Intravenous  Airway Management Planned: LMA  Additional Equipment:   Intra-op Plan:   Post-operative Plan:   Informed Consent: I have reviewed the patients History and Physical, chart, labs and discussed the procedure including the risks, benefits and alternatives for the proposed anesthesia with the patient or authorized representative who has indicated his/her understanding and acceptance.   Dental Advisory Given  Plan Discussed with: CRNA and Surgeon  Anesthesia Plan Comments:         Anesthesia Quick Evaluation

## 2015-05-13 ENCOUNTER — Ambulatory Visit (HOSPITAL_COMMUNITY): Payer: Medicaid Other | Admitting: Anesthesiology

## 2015-05-13 ENCOUNTER — Encounter (HOSPITAL_COMMUNITY): Payer: Self-pay | Admitting: *Deleted

## 2015-05-13 ENCOUNTER — Ambulatory Visit (HOSPITAL_COMMUNITY)
Admission: RE | Admit: 2015-05-13 | Discharge: 2015-05-13 | Disposition: A | Discharge status: Home / Self Care | Payer: Medicaid Other | Source: Ambulatory Visit | Attending: Obstetrics and Gynecology | Admitting: Obstetrics and Gynecology

## 2015-05-13 ENCOUNTER — Encounter (HOSPITAL_COMMUNITY)
Admission: RE | Disposition: A | Discharge status: Home / Self Care | Payer: Self-pay | Source: Ambulatory Visit | Attending: Obstetrics and Gynecology

## 2015-05-13 DIAGNOSIS — N871 Moderate cervical dysplasia: Secondary | ICD-10-CM | POA: Insufficient documentation

## 2015-05-13 DIAGNOSIS — A630 Anogenital (venereal) warts: Secondary | ICD-10-CM | POA: Insufficient documentation

## 2015-05-13 DIAGNOSIS — F17210 Nicotine dependence, cigarettes, uncomplicated: Secondary | ICD-10-CM | POA: Insufficient documentation

## 2015-05-13 HISTORY — PX: LEEP: SHX91

## 2015-05-13 HISTORY — PX: CERVICAL CONIZATION W/BX: SHX1330

## 2015-05-13 LAB — PREGNANCY, URINE: Preg Test, Ur: NEGATIVE

## 2015-05-13 SURGERY — LEEP (LOOP ELECTROSURGICAL EXCISION PROCEDURE)
Anesthesia: General | Site: Vagina

## 2015-05-13 MED ORDER — ONDANSETRON HCL 4 MG/2ML IJ SOLN
INTRAMUSCULAR | Status: AC
  Filled 2015-05-13: qty 2

## 2015-05-13 MED ORDER — PROPOFOL 10 MG/ML IV BOLUS
INTRAVENOUS | Status: AC
  Filled 2015-05-13: qty 20

## 2015-05-13 MED ORDER — MIDAZOLAM HCL 2 MG/2ML IJ SOLN
INTRAMUSCULAR | Status: AC
  Filled 2015-05-13: qty 2

## 2015-05-13 MED ORDER — DEXAMETHASONE SODIUM PHOSPHATE 4 MG/ML IJ SOLN
INTRAMUSCULAR | Status: AC
  Filled 2015-05-13: qty 1

## 2015-05-13 MED ORDER — TRAMADOL HCL 50 MG PO TABS: 50.0000 mg | ORAL_TABLET | Freq: Four times a day (QID) | ORAL | Status: DC | PRN

## 2015-05-13 MED ORDER — MIDAZOLAM HCL 2 MG/2ML IJ SOLN
INTRAMUSCULAR | Status: DC | PRN
  Administered 2015-05-13: 2 mg via INTRAVENOUS

## 2015-05-13 MED ORDER — FERRIC SUBSULFATE SOLN
Status: DC | PRN
  Administered 2015-05-13: 1 via TOPICAL

## 2015-05-13 MED ORDER — FERRIC SUBSULFATE 259 MG/GM EX SOLN
CUTANEOUS | Status: AC
  Filled 2015-05-13: qty 8

## 2015-05-13 MED ORDER — LACTATED RINGERS IV SOLN
INTRAVENOUS | Status: DC
  Administered 2015-05-13 (×2): via INTRAVENOUS

## 2015-05-13 MED ORDER — LIDOCAINE-EPINEPHRINE (PF) 1 %-1:200000 IJ SOLN
INTRAMUSCULAR | Status: AC
  Filled 2015-05-13: qty 30

## 2015-05-13 MED ORDER — LIDOCAINE-EPINEPHRINE (PF) 1 %-1:200000 IJ SOLN
INTRAMUSCULAR | Status: DC | PRN
  Administered 2015-05-13: 10 mL

## 2015-05-13 MED ORDER — DEXAMETHASONE SODIUM PHOSPHATE 10 MG/ML IJ SOLN
INTRAMUSCULAR | Status: DC | PRN
  Administered 2015-05-13: 4 mg via INTRAVENOUS

## 2015-05-13 MED ORDER — SCOPOLAMINE 1 MG/3DAYS TD PT72: 1.0000 | MEDICATED_PATCH | Freq: Once | TRANSDERMAL | Status: DC

## 2015-05-13 MED ORDER — FENTANYL CITRATE (PF) 100 MCG/2ML IJ SOLN
INTRAMUSCULAR | Status: AC
  Filled 2015-05-13: qty 2

## 2015-05-13 MED ORDER — PROPOFOL 10 MG/ML IV BOLUS
INTRAVENOUS | Status: DC | PRN
  Administered 2015-05-13: 150 mg via INTRAVENOUS
  Administered 2015-05-13: 50 mg via INTRAVENOUS

## 2015-05-13 MED ORDER — LIDOCAINE HCL (CARDIAC) 20 MG/ML IV SOLN
INTRAVENOUS | Status: AC
  Filled 2015-05-13: qty 5

## 2015-05-13 MED ORDER — FENTANYL CITRATE (PF) 100 MCG/2ML IJ SOLN
INTRAMUSCULAR | Status: DC | PRN
  Administered 2015-05-13 (×4): 50 ug via INTRAVENOUS

## 2015-05-13 MED ORDER — IODINE STRONG (LUGOLS) 5 % PO SOLN
ORAL | Status: AC
  Filled 2015-05-13: qty 1

## 2015-05-13 MED ORDER — KETOROLAC TROMETHAMINE 30 MG/ML IJ SOLN
INTRAMUSCULAR | Status: AC
  Filled 2015-05-13: qty 1

## 2015-05-13 MED ORDER — KETOROLAC TROMETHAMINE 30 MG/ML IJ SOLN
INTRAMUSCULAR | Status: DC | PRN
  Administered 2015-05-13: 30 mg via INTRAVENOUS

## 2015-05-13 MED ORDER — FENTANYL CITRATE (PF) 100 MCG/2ML IJ SOLN
INTRAMUSCULAR | Status: AC
  Administered 2015-05-13: 25 ug via INTRAVENOUS
  Filled 2015-05-13: qty 2

## 2015-05-13 MED ORDER — ACETIC ACID 5 % SOLN
Status: AC
  Filled 2015-05-13: qty 500

## 2015-05-13 MED ORDER — ONDANSETRON HCL 4 MG/2ML IJ SOLN
INTRAMUSCULAR | Status: DC | PRN
  Administered 2015-05-13: 4 mg via INTRAVENOUS

## 2015-05-13 MED ORDER — LACTATED RINGERS IV SOLN: INTRAVENOUS | Status: DC

## 2015-05-13 MED ORDER — FENTANYL CITRATE (PF) 100 MCG/2ML IJ SOLN
25.0000 ug | INTRAMUSCULAR | Status: DC | PRN
  Administered 2015-05-13 (×2): 25 ug via INTRAVENOUS

## 2015-05-13 MED ORDER — LIDOCAINE HCL (CARDIAC) 20 MG/ML IV SOLN
INTRAVENOUS | Status: DC | PRN
  Administered 2015-05-13: 60 mg via INTRAVENOUS

## 2015-05-13 SURGICAL SUPPLY — 42 items
APPLICATOR COTTON TIP 6IN STRL (MISCELLANEOUS) ×3 IMPLANT
BLADE SURG 11 STRL SS (BLADE) ×3 IMPLANT
CLOTH BEACON ORANGE TIMEOUT ST (SAFETY) ×3 IMPLANT
CONT PATH 16OZ SNAP LID 3702 (MISCELLANEOUS) ×2 IMPLANT
CONTAINER PREFILL 10% NBF 60ML (FORM) ×5 IMPLANT
COUNTER NEEDLE 1200 MAGNETIC (NEEDLE) ×3 IMPLANT
ELECT BALL LEEP 5MM RED (ELECTRODE) ×2 IMPLANT
ELECT LOOP LEEP RND 15X12 GRN (CUTTING LOOP)
ELECT LOOP LEEP RND 20X12 WHT (CUTTING LOOP) ×3
ELECT REM PT RETURN 9FT ADLT (ELECTROSURGICAL) ×3
ELECTRODE LOOP LP RND 15X12GRN (CUTTING LOOP) IMPLANT
ELECTRODE LOOP LP RND 20X12WHT (CUTTING LOOP) IMPLANT
ELECTRODE REM PT RTRN 9FT ADLT (ELECTROSURGICAL) ×1 IMPLANT
EXTENDER ELECT LOOP LEEP 10CM (CUTTING LOOP) ×2 IMPLANT
GAUZE SPONGE 4X4 16PLY XRAY LF (GAUZE/BANDAGES/DRESSINGS) ×2 IMPLANT
GLOVE BIO SURGEON STRL SZ7 (GLOVE) ×3 IMPLANT
GLOVE BIOGEL PI IND STRL 7.0 (GLOVE) ×1 IMPLANT
GLOVE BIOGEL PI INDICATOR 7.0 (GLOVE) ×2
GOWN STRL REUS W/TWL LRG LVL3 (GOWN DISPOSABLE) ×6 IMPLANT
HOSE NS SMOKE EVAC 7/8 X6 (MISCELLANEOUS) ×1 IMPLANT
HOSE NS SMOKE EVAC 7/8 X6' (MISCELLANEOUS)
NS IRRIG 1000ML POUR BTL (IV SOLUTION) ×1 IMPLANT
PACK VAGINAL MINOR WOMEN LF (CUSTOM PROCEDURE TRAY) ×3 IMPLANT
PAD OB MATERNITY 4.3X12.25 (PERSONAL CARE ITEMS) ×3 IMPLANT
PENCIL BUTTON HOLSTER BLD 10FT (ELECTRODE) ×3 IMPLANT
REDUCER FITTING SMOKE EVAC (MISCELLANEOUS) ×1 IMPLANT
SCOPETTES 8  STERILE (MISCELLANEOUS) ×2
SCOPETTES 8 STERILE (MISCELLANEOUS) ×2 IMPLANT
SPONGE SURGIFOAM ABS GEL 12-7 (HEMOSTASIS) IMPLANT
SUT CHROMIC 1 CT1 27 (SUTURE) IMPLANT
SUT ETHILON 3 0 PS 1 18 (SUTURE) ×2 IMPLANT
SUT SILK 2 0 SH (SUTURE) ×3 IMPLANT
SUT VIC AB 0 CT1 27 (SUTURE)
SUT VIC AB 0 CT1 27XBRD ANBCTR (SUTURE) IMPLANT
TOWEL OR 17X24 6PK STRL BLUE (TOWEL DISPOSABLE) ×6 IMPLANT
TUBING CONNECTING 10 (TUBING) ×1 IMPLANT
TUBING CONNECTING 10' (TUBING) ×1
TUBING NON-CON 1/4 X 20 CONN (TUBING) ×2 IMPLANT
TUBING NON-CON 1/4 X 20' CONN (TUBING) ×1
TUBING SMOKE EVAC HOSE ADAPTER (MISCELLANEOUS) ×1 IMPLANT
WATER STERILE IRR 1000ML POUR (IV SOLUTION) ×3 IMPLANT
YANKAUER SUCT BULB TIP NO VENT (SUCTIONS) ×1 IMPLANT

## 2015-05-13 NOTE — Anesthesia Procedure Notes (Signed)
Procedure Name: LMA Insertion Date/Time: 05/13/2015 8:16 AM Performed by: Yolonda KidaARVER, Shawntez Dickison L Pre-anesthesia Checklist: Patient identified, Patient being monitored, Suction available and Emergency Drugs available Patient Re-evaluated:Patient Re-evaluated prior to inductionOxygen Delivery Method: Circle system utilized Intubation Type: IV induction LMA: LMA inserted LMA Size: 4.0 Number of attempts: 1 Placement Confirmation: CO2 detector,  positive ETCO2 and breath sounds checked- equal and bilateral Tube secured with: Tape Dental Injury: Teeth and Oropharynx as per pre-operative assessment

## 2015-05-13 NOTE — Transfer of Care (Signed)
Immediate Anesthesia Transfer of Care Note  Patient: [name]  Procedure(s) Performed: Procedure(s): LOOP ELECTROSURGICAL EXCISION PROCEDURE (LEEP) (N/A) CONIZATION CERVIX WITH BIOPSY (N/A)  Patient Location: PACU  Anesthesia Type:General  Level of Consciousness: awake, alert , oriented and patient cooperative  Airway & Oxygen Therapy: Patient Spontanous Breathing and Patient connected to nasal cannula oxygen  Post-op Assessment: Report given to RN and Post -op Vital signs reviewed and stable  Post vital signs: Reviewed and stable  Last Vitals:  Filed Vitals:   05/13/15 0723  BP: 127/71  Pulse: 93  Temp: 36.8 C  Resp: 20    Complications: No apparent anesthesia complications

## 2015-05-13 NOTE — Progress Notes (Signed)
There has been no change in the patients history, status or exam since the history and physical.  Filed Vitals:   05/01/15 1613 05/13/15 0723  BP:  127/71  Pulse:  93  Temp:  98.3 F (36.8 C)  TempSrc:  Oral  Resp:  20  Height: 5\' 1"  (1.549 m)   Weight: 56.7 kg (125 lb)   SpO2:  98%    Lab Results  Component Value Date   WBC 16.1* 12/24/2014   HGB 9.7* 12/24/2014   HCT 28.7* 12/24/2014   MCV 92.6 12/24/2014   PLT 236 12/24/2014    Mckaylie Vasey A

## 2015-05-13 NOTE — Brief Op Note (Signed)
05/13/2015  8:48 AM  PATIENT:  [name]  25 y.o. female  PRE-OPERATIVE DIAGNOSIS:  DYSPLASIA, high grade  POST-OPERATIVE DIAGNOSIS:  DYSPLASIA, high grade  PROCEDURE:  Procedure(s): LOOP ELECTROSURGICAL EXCISION PROCEDURE (LEEP) (N/A) CONIZATION CERVIX WITH BIOPSY (N/A)  SURGEON:  Surgeon(s) and Role:    * Carrington ClampMichelle Kendrick Haapala, MD - Primary    ANESTHESIA:   general  EBL:  Total I/O In: 1100 [I.V.:1100] Out: 50 [Blood:50]   LOCAL MEDICATIONS USED:  XYLOCAINE   SPECIMEN:  Source of Specimen:  ectocervix, stitch at 12:00; ectocervix, stitch at 6:00;  endocervix, stitch at superior margin.  ECC  DISPOSITION OF SPECIMEN:  PATHOLOGY  COUNTS:  YES  TOURNIQUET:  * No tourniquets in log *  DICTATION: .Note written in EPIC  PLAN OF CARE: Discharge to home after PACU  PATIENT DISPOSITION:  PACU - hemodynamically stable.   Delay start of Pharmacological VTE agent (>24hrs) due to surgical blood loss or risk of bleeding: not applicable  Technique:  After adequate anesthesia was administered and pt was draped, the speculum was placed in the vagina.  Acetic acid was applied to cervix.  10 cc of lidocaine with epi was injected in paracervical block.  Lugols was applied.  A 2x1 loop was used to removed 2 pieces of ectocervix and was also used to remove the endocervical piece.  One string of the IUD was cut- the other remained in place.  Rollerball cautery was used to achieve hemostasis.  The vagina on the L side was accidentally hit with the rollerball but only superficially.  After hemostasis was achieved, Monsels was applied and all instruments were removed from the vagina.   Pt tolerated the procedure and was returned to the recovery room.

## 2015-05-13 NOTE — Anesthesia Postprocedure Evaluation (Signed)
Anesthesia Post Note  Patient: [name]  Procedure(s) Performed: Procedure(s) (LRB): LOOP ELECTROSURGICAL EXCISION PROCEDURE (LEEP) (N/A) CONIZATION CERVIX WITH BIOPSY (N/A)  Patient location during evaluation: PACU Anesthesia Type: General Level of consciousness: awake and alert Pain management: pain level controlled Vital Signs Assessment: post-procedure vital signs reviewed and stable Respiratory status: spontaneous breathing, nonlabored ventilation, respiratory function stable and patient connected to nasal cannula oxygen Cardiovascular status: blood pressure returned to baseline and stable Postop Assessment: no signs of nausea or vomiting Anesthetic complications: no    Last Vitals:  Filed Vitals:   05/13/15 0930 05/13/15 0940  BP: 118/69   Pulse: 100 112  Temp:    Resp: 19 18    Last Pain:  Filed Vitals:   05/13/15 0946  PainSc: 10-Worst pain ever                 Cambryn Charters L

## 2015-05-13 NOTE — OR Nursing (Signed)
Pt arrived in PACU from OR moaning, crying and then she started cursing d/t pain. Pt was attended to in a timely manner with pain medicine. After 2 doses of Fentanyl patient continued to cry, moan and say that pain was 10/10.  Pt asked what she was going to prescribed upon discharge. I informed her it was going to be Tramadol. "That doesn't work for me, she is going to need to prescribe me Vicodin or Percocet." MD called and she said that was what the patient was going to receive. Once patient found out she was receiving Tramadol she sat up in bed and said "I need to get out of here." Pt was uncooperative and at times loud. "I am not afraid to show my ass." Pt refused to eat crackers. She threw Sprite in the trash can. She did report that she urinated in the restroom. When going over discharge papers pt was not listening, instead she was saying "I am so mad I need to get out of here. I don't care if I have to go to an ER I am not going back to the doctor." Boyfriend at her side was trying to calm her with no success.  Pt insisted on walking out with boyfriend, after talking to patient agreed to w/c.  Upon discharge patient or boyfriend took extra papers with them including: consent and call back papers. This RN called patient and boyfriend to please return papers. They did not return to the hospital with papers.

## 2015-05-13 NOTE — Discharge Instructions (Signed)
Conization of the Cervix, Care After  Do NOT take Motrin/Advil/Aleve until after 4pm today.  Remove Scope patch within 72 hrs after surgery.  Drink enough liquids to keep urine light yellow/clear. If you have not urinated within 6hrs after discharge call MD.  These instructions give you information on caring for yourself after your procedure. Your doctor may also give you more specific instructions. Call your doctor if you have any problems or questions after your procedure. HOME CARE   Have someone drive you home after the procedure.  Only take medicines as told by your doctor. Do not take aspirin. It can cause bleeding.  Take showers for the first week. Do not take baths, swim, or use hot tubs until your doctor says it is okay.  Do not douche, use tampons, or have sex (sexual intercourse) until your doctor says it is okay.  For 7-14 days, avoid:  Being very active.  Exercising.  Heavy lifting.  Resume your usual diet unless your doctor tells you differently.  If you cannot poop (you are constipated), you may:  Take a mild laxative as told by your doctor.  Add fruit and bran to your diet.  Make sure to drink enough fluids to keep your pee (urine) clear or pale yellow.  Keep your follow-up appointments as told by your doctor. GET HELP IF:  You have a rash.  You are dizzy or lightheaded.  You feel sick to your stomach (nauseous).  You develop a bad smelling vaginal discharge. GET HELP RIGHT AWAY IF:   You have blood clots or bleeding that is heavier than a normal menstrual period. For example, you soak a pad in less than 1 hour.  You have bright red bleeding.  You have a fever over 101F (38.3C) or lasting symptoms for more than 2-3 days.  You have a fever over 101F (38.3C) and your symptoms suddenly get worse.  You have increasing cramps.  You pass out (faint).  You have pain when peeing.  You have bloody pee.  You start throwing up (vomiting).  Your  pain does not get better when you take your medicine.  Your have a lot of pain.  Your pain is getting worse. MAKE SURE YOU:  Understand these instructions.  Will watch your condition.  Will get help right away if you are not doing well or get worse.   This information is not intended to replace advice given to you by your health care provider. Make sure you discuss any questions you have with your health care provider.   Document Released: 11/04/2007 Document Revised: 01/30/2013 Document Reviewed: 07/21/2012 Elsevier Interactive Patient Education [employer]2016 [employer]

## 2015-05-13 NOTE — Op Note (Signed)
05/13/2015  8:48 AM  PATIENT:  [name]  24 y.o. female  PRE-OPERATIVE DIAGNOSIS:  DYSPLASIA, high grade  POST-OPERATIVE DIAGNOSIS:  DYSPLASIA, high grade  PROCEDURE:  Procedure(s): LOOP ELECTROSURGICAL EXCISION PROCEDURE (LEEP) (N/A) CONIZATION CERVIX WITH BIOPSY (N/A)  SURGEON:  Surgeon(s) and Role:    * Rosemaria Inabinet, MD - Primary    ANESTHESIA:   general  EBL:  Total I/O In: 1100 [I.V.:1100] Out: 50 [Blood:50]   LOCAL MEDICATIONS USED:  XYLOCAINE   SPECIMEN:  Source of Specimen:  ectocervix, stitch at 12:00; ectocervix, stitch at 6:00;  endocervix, stitch at superior margin.  ECC  DISPOSITION OF SPECIMEN:  PATHOLOGY  COUNTS:  YES  TOURNIQUET:  * No tourniquets in log *  DICTATION: .Note written in EPIC  PLAN OF CARE: Discharge to home after PACU  PATIENT DISPOSITION:  PACU - hemodynamically stable.   Delay start of Pharmacological VTE agent (>24hrs) due to surgical blood loss or risk of bleeding: not applicable  Technique:  After adequate anesthesia was administered and pt was draped, the speculum was placed in the vagina.  Acetic acid was applied to cervix.  10 cc of lidocaine with epi was injected in paracervical block.  Lugols was applied.  A 2x1 loop was used to removed 2 pieces of ectocervix and was also used to remove the endocervical piece.  One string of the IUD was cut- the other remained in place.  Rollerball cautery was used to achieve hemostasis.  The vagina on the L side was accidentally hit with the rollerball but only superficially.  After hemostasis was achieved, Monsels was applied and all instruments were removed from the vagina.   Pt tolerated the procedure and was returned to the recovery room.   

## 2015-05-14 ENCOUNTER — Encounter (HOSPITAL_COMMUNITY): Payer: Self-pay | Admitting: Obstetrics and Gynecology

## 2015-05-17 ENCOUNTER — Encounter (HOSPITAL_COMMUNITY): Payer: Self-pay | Admitting: *Deleted

## 2015-05-17 ENCOUNTER — Inpatient Hospital Stay (HOSPITAL_COMMUNITY)
Admission: AD | Admit: 2015-05-17 | Discharge: 2015-05-17 | Disposition: A | Discharge status: Home / Self Care | Payer: Medicaid Other | Source: Ambulatory Visit | Attending: Obstetrics and Gynecology | Admitting: Obstetrics and Gynecology

## 2015-05-17 DIAGNOSIS — A499 Bacterial infection, unspecified: Secondary | ICD-10-CM | POA: Diagnosis not present

## 2015-05-17 DIAGNOSIS — R110 Nausea: Secondary | ICD-10-CM | POA: Insufficient documentation

## 2015-05-17 DIAGNOSIS — R109 Unspecified abdominal pain: Secondary | ICD-10-CM | POA: Insufficient documentation

## 2015-05-17 DIAGNOSIS — F419 Anxiety disorder, unspecified: Secondary | ICD-10-CM | POA: Diagnosis not present

## 2015-05-17 DIAGNOSIS — Z79899 Other long term (current) drug therapy: Secondary | ICD-10-CM | POA: Diagnosis not present

## 2015-05-17 DIAGNOSIS — F17210 Nicotine dependence, cigarettes, uncomplicated: Secondary | ICD-10-CM | POA: Diagnosis not present

## 2015-05-17 DIAGNOSIS — B9689 Other specified bacterial agents as the cause of diseases classified elsewhere: Secondary | ICD-10-CM

## 2015-05-17 DIAGNOSIS — N760 Acute vaginitis: Secondary | ICD-10-CM | POA: Diagnosis not present

## 2015-05-17 DIAGNOSIS — N898 Other specified noninflammatory disorders of vagina: Secondary | ICD-10-CM | POA: Insufficient documentation

## 2015-05-17 DIAGNOSIS — K5900 Constipation, unspecified: Secondary | ICD-10-CM

## 2015-05-17 DIAGNOSIS — Z9889 Other specified postprocedural states: Secondary | ICD-10-CM

## 2015-05-17 HISTORY — DX: Anxiety disorder, unspecified: F41.9

## 2015-05-17 LAB — WET PREP, GENITAL
SPERM: NONE SEEN
TRICH WET PREP: NONE SEEN
YEAST WET PREP: NONE SEEN

## 2015-05-17 LAB — URINALYSIS, ROUTINE W REFLEX MICROSCOPIC
BILIRUBIN URINE: NEGATIVE
Glucose, UA: NEGATIVE mg/dL
Ketones, ur: NEGATIVE mg/dL
NITRITE: NEGATIVE
PH: 5.5 (ref 5.0–8.0)
Protein, ur: NEGATIVE mg/dL

## 2015-05-17 LAB — POCT PREGNANCY, URINE: Preg Test, Ur: NEGATIVE

## 2015-05-17 LAB — URINE MICROSCOPIC-ADD ON

## 2015-05-17 MED ORDER — OMEPRAZOLE 20 MG PO CPDR: 20.0000 mg | DELAYED_RELEASE_CAPSULE | Freq: Every day | ORAL | Status: DC

## 2015-05-17 MED ORDER — METRONIDAZOLE 500 MG PO TABS: 500.0000 mg | ORAL_TABLET | Freq: Two times a day (BID) | ORAL | Status: DC

## 2015-05-17 NOTE — Progress Notes (Signed)
Erin Lawrence NP in earlier to discuss test results and d/c plan. Written and verbal d/c instructions given and understanding voiced 

## 2015-05-17 NOTE — MAU Note (Addendum)
Had LEEP procedure Monday. On Friday pulled out a clot that looked like skin. Today have black/gray vag d/c that has bad odor. Bad cramping. Had IUD placed 04/09/2015

## 2015-05-17 NOTE — MAU Provider Note (Signed)
History     CSN: [id number]  Arrival date and time: 05/17/15 4098   First Provider Initiated Contact with Patient 05/17/15 2018       Chief Complaint  Patient presents with  . Post-op Problem   HPI  BURNA ATLAS is 25 y.o. female who presents with vaginal discharge and abdominal pain. Patient is 4 days s/p LEEP procedure. Reports black foul smelling discharge since yesterday. Reports that abdominal cramping is worsening since procedure. Currently rates 8/10. Has been taking percocet & iboprofen with relief but states she doesn't have much percocet left. Reports some nausea, no vomiting. Has been constipation since surgery. Last BM was 2 days ago; had to strain for hard stool. Is not treating the constipation.  Denies vaginal bleeding, dysuria, or fever.    OB History    Gravida Para Term Preterm AB TAB SAB Ectopic Multiple Living   0 0 0 0 0 0 3      Past Medical History  Diagnosis Date  . Ovarian cyst 2012  . HPV (human papilloma virus) infection   . Shingles   . Vaginal Pap smear, abnormal   . Anxiety     Past Surgical History  Procedure Laterality Date  . Laceration repair      neck  . Cervical biopsy    . Leep N/A 05/13/2015    Procedure: LOOP ELECTROSURGICAL EXCISION PROCEDURE (LEEP);  Surgeon: Carrington Clamp, MD;  Location: [facility];  Service: Gynecology;  Laterality: N/A;  . Cervical conization w/bx N/A 05/13/2015    Procedure: CONIZATION CERVIX WITH BIOPSY;  Surgeon: Carrington Clamp, MD;  Location: [facility];  Service: Gynecology;  Laterality: N/A;    Family History  Problem Relation Age of Onset  . Cancer Mother   . Cancer Maternal Aunt     Social History  Substance Use Topics  . Smoking status: Current Every Day Smoker -- 0.50 packs/day for 10 years    Types: Cigarettes  . Smokeless tobacco: Never Used  . Alcohol Use: 1.8 oz/week    3 Cans of beer per week     Comment: socially beer    Allergies:  Allergies  Allergen Reactions  . Bee Venom  Anaphylaxis, Shortness Of Breath and Swelling  . Tramadol Itching and Nausea And Vomiting    Prescriptions prior to admission  Medication Sig Dispense Refill Last Dose  . EPINEPHrine (EPIPEN 2-PAK) 0.3 mg/0.3 mL IJ SOAJ injection Inject 0.3 mg into the muscle once as needed (for severe allergic reaction).   Past Month at Unknown time  . ibuprofen (ADVIL,MOTRIN) 600 MG tablet Take 600 mg by mouth every 6 (six) hours as needed for headache or mild pain.   05/17/2015 at 0800  . levonorgestrel (MIRENA) 20 MCG/24HR IUD 1 each by Intrauterine route once.   04/09/2015 at unknown  . oxyCODONE-acetaminophen (PERCOCET/ROXICET) 5-325 MG tablet Take 1-2 tablets by mouth every 4 (four) hours as needed for severe pain.   05/17/2015 at 1500  . traMADol (ULTRAM) 50 MG tablet Take 1 tablet (50 mg total) by mouth every 6 (six) hours as needed. (Patient not taking: Reported on 05/17/2015) 30 tablet 0     Review of Systems  Constitutional: Negative.   Gastrointestinal: Positive for nausea, abdominal pain and constipation. Negative for vomiting, diarrhea and blood in stool.  Genitourinary: Negative for dysuria.       + vaginal discharge No vaginal bleeding   Physical Exam   Blood pressure 128/71, pulse 80, temperature 97.8 F (  36.6 C), resp. rate 18, height 5\' 1"  (1.549 m), weight 127 lb (57.607 kg), last menstrual period 04/09/2015, not currently breastfeeding.  Physical Exam  Nursing note and vitals reviewed. Constitutional: She is oriented to person, place, and time. She appears well-developed and well-nourished. No distress.  HENT:  Head: Normocephalic and atraumatic.  Eyes: Conjunctivae are normal. Right eye exhibits no discharge. Left eye exhibits no discharge. No scleral icterus.  Neck: Normal range of motion.  Cardiovascular: Normal rate, regular rhythm and normal heart sounds.   No murmur heard. Respiratory: Effort normal and breath sounds normal. No respiratory distress. She has no wheezes.  GI:  Soft. Bowel sounds are normal. She exhibits no distension and no mass. There is tenderness. There is no rebound and no guarding.  Genitourinary:  Moderate amount of thick black substance in & surrounding cervix  Neurological: She is alert and oriented to person, place, and time.  Skin: Skin is warm and dry. She is not diaphoretic.  Psychiatric: She has a normal mood and affect. Her behavior is normal. Judgment and thought content normal.    MAU Course  Procedures Results for orders placed or performed during the hospital encounter of 05/17/15 (from the past 24 hour(s))  Urinalysis, Routine w reflex microscopic (not [facility])     Status: Abnormal   Collection Time: 05/17/15  7:30 PM  Result Value Ref Range   Color, Urine YELLOW YELLOW   APPearance CLEAR CLEAR   Specific Gravity, Urine <1.005 (L) 1.005 - 1.030   pH 5.5 5.0 - 8.0   Glucose, UA NEGATIVE NEGATIVE mg/dL   Hgb urine dipstick TRACE (A) NEGATIVE   Bilirubin Urine NEGATIVE NEGATIVE   Ketones, ur NEGATIVE NEGATIVE mg/dL   Protein, ur NEGATIVE NEGATIVE mg/dL   Nitrite NEGATIVE NEGATIVE   Leukocytes, UA TRACE (A) NEGATIVE  Urine microscopic-add on     Status: Abnormal   Collection Time: 05/17/15  7:30 PM  Result Value Ref Range   Squamous Epithelial / LPF 0-5 (A) NONE SEEN   WBC, UA 0-5 0 - 5 WBC/hpf   RBC / HPF 0-5 0 - 5 RBC/hpf   Bacteria, UA RARE (A) NONE SEEN  Pregnancy, urine POC     Status: None   Collection Time: 05/17/15  8:26 PM  Result Value Ref Range   Preg Test, Ur NEGATIVE NEGATIVE  Wet prep, genital     Status: Abnormal   Collection Time: 05/17/15  8:55 PM  Result Value Ref Range   Yeast Wet Prep HPF POC NONE SEEN NONE SEEN   Trich, Wet Prep NONE SEEN NONE SEEN   Clue Cells Wet Prep HPF POC PRESENT (A) NONE SEEN   WBC, Wet Prep HPF POC FEW (A) NONE SEEN   Sperm NONE SEEN     MDM UPT negative Discussed treatment for constipation & encouraged patient to decrease amount of percocet she is using as this  can lead to constipation. Patient concerned about getting an ulcer from using the ibuprofen (states that happened to her friend); discussed proper use of ibuprofen & encouraged taking PPI during this time of increased NSAID use.  Black discharge looks appropriate for post LEEP procedure & informed the patient that the discharge is likely due to the monsels paste that was used during the procedure.  S/w Dr. Dareen PianoAnderson. Ok to discharge home.   Assessment and Plan  A; 1. Vaginal discharge   2. Constipation, unspecified constipation type   3. S/P LEEP (loop electrosurgical excision procedure)  4. BV (bacterial vaginosis)     P: Discharge home Rx prilosec & flagyl (no alcohol) Discussed diet & medication for constipation F/u with Dr. Henderson Cloud as scheduled or as needed  Judeth Horn 05/17/2015, 8:17 PM

## 2015-05-17 NOTE — Discharge Instructions (Signed)
Loop Electrosurgical Excision Procedure Loop electrosurgical excision procedure (LEEP) is the removal of a portion of the lower part of the uterus (cervix). The procedure is done when there are significantly abnormal cervical cell changes. Abnormal cell changes of the cervix can lead to cancer if left in place and untreated.  The LEEP procedure itself typically only takes a few minutes. Often, it may be done in your caregiver's office. The procedure is considered safe for those who wish to get pregnant or are trying to get pregnant. Only under rare circumstances should this procedure be done if you are pregnant. LET YOUR CAREGIVER KNOW ABOUT:  Whether you are pregnant or late for your last menstrual period.  Allergies to foods or medicines.  All the medicines you are taking includingherbs, eyedrops, and over-the-counter medicines, and creams.  Use of steroids (by mouth or creams).  Previous problems with anesthetics or numbing medicine.  Previous gynecological surgery.  History of blood clots or bleeding problems.  Any recent or current vaginal infections (herpes, sexually transmitted infections).  Other health problems. RISKS AND COMPLICATIONS  Bleeding.  Infection.  Injury to the vagina, bladder, or rectum.  Very rare obstruction of the cervical opening that causes problems during menstruation (cervical stenosis). BEFORE THE PROCEDURE  Do not take aspirin or blood thinners (anticoagulants) for 1 week before the procedure, or as told by your caregiver.  Eat a light meal before the procedure.  Ask your caregiver about changing or stopping your regular medicines.  You may be given a pain reliever 1 or 2 hours before the procedure. PROCEDURE   A tool (speculum) is placed in the vagina. This allows your caregiver to see the cervix.  An iodine stain is applied to the cervix to find the area of abnormal cells to be removed.  Medicine is injected to numb the cervix (local  anesthetic).   Electricity is passed through a thin wire loop which is then used to remove (cauterize) a small segment of the affected cervix.  Light electrocautery is used to seal any small blood vessels and prevent bleeding.  A paste may be applied to the cauterized area of the cervix to help prevent bleeding.  The tissue sample is sent to the lab. It is examined under the microscope. AFTER THE PROCEDURE  Have someone drive you home.  You may have slight to moderate cramping.  You may notice a black vaginal discharge from the paste used on the cervix to prevent bleeding. This is normal.  Watch for excessive bleeding. This requires immediate medical care.  Ask when your test results will be ready. Make sure you get your test results.   This information is not intended to replace advice given to you by your health care provider. Make sure you discuss any questions you have with your health care provider.   Document Released: 04/17/2002 Document Revised: 04/19/2011 Document Reviewed: 07/07/2010 Elsevier Interactive Patient Education 2016 [employer].    Constipation, Adult Constipation is when a person has fewer than three bowel movements a week, has difficulty having a bowel movement, or has stools that are dry, hard, or larger than normal. As people grow older, constipation is more common. A low-fiber diet, not taking in enough fluids, and taking certain medicines may make constipation worse.  CAUSES   Certain medicines, such as antidepressants, pain medicine, iron supplements, antacids, and water pills.   Certain diseases, such as diabetes, irritable bowel syndrome (IBS), thyroid disease, or depression.   Not drinking enough water.  Not eating enough fiber-rich foods.   Stress or travel.   Lack of physical activity or exercise.   Ignoring the urge to have a bowel movement.   Using laxatives too much.  SIGNS AND SYMPTOMS   Having fewer than three bowel  movements a week.   Straining to have a bowel movement.   Having stools that are hard, dry, or larger than normal.   Feeling full or bloated.   Pain in the lower abdomen.   Not feeling relief after having a bowel movement.  DIAGNOSIS  Your health care provider will take a medical history and perform a physical exam. Further testing may be done for severe constipation. Some tests may include:  A barium enema X-ray to examine your rectum, colon, and, sometimes, your small intestine.   A sigmoidoscopy to examine your lower colon.   A colonoscopy to examine your entire colon. TREATMENT  Treatment will depend on the severity of your constipation and what is causing it. Some dietary treatments include drinking more fluids and eating more fiber-rich foods. Lifestyle treatments may include regular exercise. If these diet and lifestyle recommendations do not help, your health care provider may recommend taking over-the-counter laxative medicines to help you have bowel movements. Prescription medicines may be prescribed if over-the-counter medicines do not work.  HOME CARE INSTRUCTIONS   Eat foods that have a lot of fiber, such as fruits, vegetables, whole grains, and beans.  Limit foods high in fat and processed sugars, such as french fries, hamburgers, cookies, candies, and soda.   A fiber supplement may be added to your diet if you cannot get enough fiber from foods.   Drink enough fluids to keep your urine clear or pale yellow.   Exercise regularly or as directed by your health care provider.   Go to the restroom when you have the urge to go. Do not hold it.   Only take over-the-counter or prescription medicines as directed by your health care provider. Do not take other medicines for constipation without talking to your health care provider first.  SEEK IMMEDIATE MEDICAL CARE IF:   You have bright red blood in your stool.   Your constipation lasts for more than 4  days or gets worse.   You have abdominal or rectal pain.   You have thin, pencil-like stools.   You have unexplained weight loss. MAKE SURE YOU:   Understand these instructions.  Will watch your condition.  Will get help right away if you are not doing well or get worse.   This information is not intended to replace advice given to you by your health care provider. Make sure you discuss any questions you have with your health care provider.   Document Released: 10/24/2003 Document Revised: 02/15/2014 Document Reviewed: 11/06/2012 Elsevier Interactive Patient Education [employer] Elsevier Inc.

## 2015-05-17 NOTE — Progress Notes (Signed)
Wet prep obtained.   

## 2015-05-22 ENCOUNTER — Emergency Department (HOSPITAL_COMMUNITY)
Admission: EM | Admit: 2015-05-22 | Discharge: 2015-05-22 | Disposition: A | Discharge status: Home / Self Care | Payer: Medicaid Other | Attending: Emergency Medicine | Admitting: Emergency Medicine

## 2015-05-22 ENCOUNTER — Encounter (HOSPITAL_COMMUNITY): Payer: Self-pay | Admitting: *Deleted

## 2015-05-22 DIAGNOSIS — Z79899 Other long term (current) drug therapy: Secondary | ICD-10-CM | POA: Diagnosis not present

## 2015-05-22 DIAGNOSIS — Z8742 Personal history of other diseases of the female genital tract: Secondary | ICD-10-CM | POA: Diagnosis not present

## 2015-05-22 DIAGNOSIS — K0889 Other specified disorders of teeth and supporting structures: Secondary | ICD-10-CM | POA: Insufficient documentation

## 2015-05-22 DIAGNOSIS — Z8619 Personal history of other infectious and parasitic diseases: Secondary | ICD-10-CM | POA: Diagnosis not present

## 2015-05-22 DIAGNOSIS — F17210 Nicotine dependence, cigarettes, uncomplicated: Secondary | ICD-10-CM | POA: Diagnosis not present

## 2015-05-22 DIAGNOSIS — Z8659 Personal history of other mental and behavioral disorders: Secondary | ICD-10-CM | POA: Diagnosis not present

## 2015-05-22 DIAGNOSIS — Z792 Long term (current) use of antibiotics: Secondary | ICD-10-CM | POA: Diagnosis not present

## 2015-05-22 MED ORDER — HYDROCODONE-ACETAMINOPHEN 5-325 MG PO TABS
2.0000 | ORAL_TABLET | Freq: Once | ORAL | Status: AC
  Administered 2015-05-22: 2 via ORAL
  Filled 2015-05-22: qty 2

## 2015-05-22 MED ORDER — PENICILLIN V POTASSIUM 500 MG PO TABS: 500.0000 mg | ORAL_TABLET | Freq: Four times a day (QID) | ORAL | Status: DC

## 2015-05-22 MED ORDER — HYDROCODONE-ACETAMINOPHEN 5-325 MG PO TABS: 2.0000 | ORAL_TABLET | ORAL | Status: DC | PRN

## 2015-05-22 NOTE — ED Provider Notes (Signed)
CSN: [id number]     Arrival date & time 05/22/15  1359 History  By signing my name below, I, Doreatha Martinva Mathews, attest that this documentation has been prepared under the direction and in the presence of Mady GemmaElizabeth C Kathee Tumlin, PA-C. Electronically Signed: Doreatha MartinEva Mathews, ED Scribe. 05/22/2015. 2:17 PM.     Chief Complaint  Patient presents with  . Dental Pain    The history is provided by the patient. No language interpreter was used.    HPI Comments: [name] is a 25 y.o. female who presents to the Emergency Department complaining of moderate, constant left upper dental pain onset a week ago with associated gingival irritation. She also notes a low-grade fever of 100.3 yesterday that resolved after she took Nyquil. Pt states her pain is worsened with eating. She reports no relief of pain with 800 mg ibuprofen. She is not currently followed by a dentist. NKDA. Denies fever today, chills, difficulty swallowing or tolerating secretions.    Past Medical History  Diagnosis Date  . Ovarian cyst 2012  . HPV (human papilloma virus) infection   . Shingles   . Vaginal Pap smear, abnormal   . Anxiety    Past Surgical History  Procedure Laterality Date  . Laceration repair      neck  . Cervical biopsy    . Leep N/A 05/13/2015    Procedure: LOOP ELECTROSURGICAL EXCISION PROCEDURE (LEEP);  Surgeon: Carrington ClampMichelle Horvath, MD;  Location: [facility];  Service: Gynecology;  Laterality: N/A;  . Cervical conization w/bx N/A 05/13/2015    Procedure: CONIZATION CERVIX WITH BIOPSY;  Surgeon: Carrington ClampMichelle Horvath, MD;  Location: [facility];  Service: Gynecology;  Laterality: N/A;   Family History  Problem Relation Age of Onset  . Cancer Mother   . Cancer Maternal Aunt    Social History  Substance Use Topics  . Smoking status: Current Every Day Smoker -- 0.50 packs/day for 10 years    Types: Cigarettes  . Smokeless tobacco: Never Used  . Alcohol Use: 1.8 oz/week    3 Cans of beer per week     Comment: socially beer    OB History    Gravida Para Term Preterm AB TAB SAB Ectopic Multiple Living   3 3 3  0 0 0 0 0 0 3      Review of Systems  Constitutional: Negative for fever and chills.  HENT: Positive for dental problem. Negative for trouble swallowing.      Allergies  Bee venom and Tramadol  Home Medications   Prior to Admission medications   Medication Sig Start Date End Date Taking? Authorizing Provider  EPINEPHrine (EPIPEN 2-PAK) 0.3 mg/0.3 mL IJ SOAJ injection Inject 0.3 mg into the muscle once as needed (for severe allergic reaction).    Historical Provider, MD  HYDROcodone-acetaminophen (NORCO/VICODIN) 5-325 MG tablet Take 2 tablets by mouth every 4 (four) hours as needed. 05/22/15   Mady GemmaElizabeth C Purnell Daigle, PA-C  ibuprofen (ADVIL,MOTRIN) 600 MG tablet Take 600 mg by mouth every 6 (six) hours as needed for headache or mild pain.    Historical Provider, MD  levonorgestrel (MIRENA) 20 MCG/24HR IUD 1 each by Intrauterine route once.    Historical Provider, MD  metroNIDAZOLE (FLAGYL) 500 MG tablet Take 1 tablet (500 mg total) by mouth 2 (two) times daily. 05/17/15   Judeth HornErin Lawrence, NP  omeprazole (PRILOSEC) 20 MG capsule Take 1 capsule (20 mg total) by mouth daily. 05/17/15   Judeth HornErin Lawrence, NP  oxyCODONE-acetaminophen (PERCOCET/ROXICET) 5-325 MG tablet Take  1-2 tablets by mouth every 4 (four) hours as needed for severe pain.    Historical Provider, MD  penicillin v potassium (VEETID) 500 MG tablet Take 1 tablet (500 mg total) by mouth 4 (four) times daily. 05/22/15   Mady Gemma, PA-C    BP 127/72 mmHg  Pulse 71  Temp(Src) 98.2 F (36.8 C)  Resp 18  SpO2 99%  LMP 04/09/2015 Physical Exam  Constitutional: She is oriented to person, place, and time. She appears well-developed and well-nourished. No distress.  Patient is tearful and appears uncomfortable due to pain.  HENT:  Head: Normocephalic and atraumatic.  Right Ear: External ear normal.  Left Ear: External ear normal.  Nose: Nose  normal.  Mouth/Throat: Uvula is midline, oropharynx is clear and moist and mucous membranes are normal. No dental abscesses. No oropharyngeal exudate, posterior oropharyngeal edema, posterior oropharyngeal erythema or tonsillar abscesses.    TTP to gumline of left upper wisdom tooth with mild associated erythema. No swelling to floor of mouth. No obvious abscess.  Eyes: Conjunctivae and EOM are normal. Right eye exhibits no discharge. Left eye exhibits no discharge. No scleral icterus.  Neck: Normal range of motion. Neck supple.  Cardiovascular: Normal rate and regular rhythm.   Pulmonary/Chest: Effort normal and breath sounds normal. No respiratory distress.  Musculoskeletal: Normal range of motion. She exhibits no edema or tenderness.  Neurological: She is alert and oriented to person, place, and time.  Skin: Skin is warm and dry. She is not diaphoretic.  Psychiatric: She has a normal mood and affect. Her behavior is normal.  Nursing note and vitals reviewed.    ED Course  Procedures (including critical care time)  DIAGNOSTIC STUDIES: Oxygen Saturation is 99% on RA, normal by my interpretation.    COORDINATION OF CARE: 2:14 PM Discussed treatment plan with pt at bedside which includes dental referral, pain management and pt agreed to plan.   MDM   Final diagnoses:  Pain, dental    25 year old female presents with left upper dental pain x 1 week. Reports fever yesterday. Denies difficulty swallowing or trouble handling her secretions. Patient is afebrile. Vital signs stable. On exam, patient has TTP to the gumline of her left upper wisdom tooth with associated erythema. No abscess requiring immediate incision and drainage. Exam not concerning for ludwig's angina or pharyngeal abscess. Will treat with short course of pain medication and PCN. Patient instructed to follow-up with dentist. Resource guide provided. Return precautions discussed. Patient safe for discharge.   BP 127/72  mmHg  Pulse 71  Temp(Src) 98.2 F (36.8 C)  Resp 18  SpO2 99%  LMP 04/09/2015  I personally performed the services described in this documentation, which was scribed in my presence. The recorded information has been reviewed and is accurate.   Mady Gemma, PA-C 05/22/15 1424  Lyndal Pulley, MD 05/22/15 2037

## 2015-05-22 NOTE — Discharge Instructions (Signed)
1. Medications: vicodin for pain, penicillin, usual home medications 2. Treatment: rest, drink plenty of fluids 3. Follow Up: please followup with your dentist for discussion of your diagnoses and further evaluation after today's visit; if you do not have a primary care doctor use the phone number listed in your discharge paperwork to find one; please return to the ER for high fever, increased pain, difficulty swallowing or handling your secretions, new or worsening symptoms  [facility] [employer] is a great source of information about community services available.  Access by dialing 2-1-1 from anywhere in [place], or by website -  PooledIncome.pl.   Other Local Resources (Updated 02/2015)  Dental  Care   Services    Phone Number and Address  Cost  [facility] For children 43 - 66 years of age:   Cleaning  Tooth brushing/flossing instruction  Sealants, fillings, crowns  Extractions  Emergency treatment  [phone] [address] [place], [place] [address] Charges based on family income.  Medicaid and some insurance plans accepted.     [facility], fillings, crowns  Extractions  Emergency treatment [phone] [address] [place], [place]  Pregnant women 71 years of age or older with a Medicaid card  [facility] [place]  Cleaning  Sealants, fillings, crowns  Extractions  Emergency treatment [phone] [address] [place], [place] Pregnant women 66 years of age or older with a Medicaid card  [facility] - [facility] For children 56 - 77 years of age:   Cleaning  Tooth brushing/flossing instruction  Sealants, fillings, crowns  Extractions  Emergency treatment Limited orthodontic services for patients with Medicaid [id number] [place], [place] [address] Medicaid and [facility] Health Choice cover for children up to age 65 and pregnant women.  Parents of children up to age 59 without Medicaid pay a reduced fee at time of service.  [facility] [employer] For children 84 - 28 years of age:   Cleaning  Tooth brushing/flossing instruction  Sealants, fillings, crowns  Extractions  Emergency treatment Limited orthodontic services for patients with Medicaid [phone] [address] [place], [place].  Medicaid and [place] Health Choice cover for children up to age 13 and pregnant women.  Parents of children up to age 83 without Medicaid pay a reduced fee.  [facility]  Sealants, fillings, crowns  Extractions  Hours: Tuesdays and Thursdays, 4:15 - 8 pm [phone] [address] [place], [place] [address] Services free of charge to [facility] residents ages 18-64 who do not have health insurance, Medicare, [place], or [place] benefits and fall within federal poverty guidelines  [employer]    Provides dental care in addition to primary medical care, nutritional counseling, and pharmacy:  Nurse, mental health, fillings, crowns  Extractions                  [phone] [facility], [address] [place], [place]  [phone] [facility], [address] [place] [place], [place]  [phone] [facility] [place], [place]  [phone] [facility], [address] [place], [place]  [phone] [facility] [address] [place], [place] Accepts [place], [place], most insurance.  Also provides  services available to all with fees adjusted based on ability to pay.    [facility]  Cleaning  Tooth brushing/flossing instruction  Sealants,  fillings, crowns  Extractions  Emergency treatment Hours: Tuesdays, Thursdays, and Fridays from 8 am to 5 pm by appointment only. [phone] [address] [place], [place] [phone] [facility] residents with Medicaid (depending on eligibility) and children with [facility] Health Choice - call for more information.  [facility]  Extractions only  Hours: 2nd and 4th Thursday of each month from 6:30 am - 9 am.   [phone] [address] [place], [place] [address] Ages 2518 and older only.  Patients are seen on a first come, first served basis.  [employer]  [employer]  Fillings  Extractions  Orthodontics  Endodontics  Implants/Crowns/Bridges  Complete and partial dentures [phone] [place], [place] Patients must complete an application for services.  There is often a waiting list.

## 2015-06-11 ENCOUNTER — Other Ambulatory Visit: Payer: Self-pay | Admitting: Obstetrics and Gynecology

## 2015-06-13 ENCOUNTER — Emergency Department (HOSPITAL_COMMUNITY)
Admission: EM | Admit: 2015-06-13 | Discharge: 2015-06-13 | Disposition: A | Discharge status: Home / Self Care | Payer: Medicaid Other | Attending: Emergency Medicine | Admitting: Emergency Medicine

## 2015-06-13 ENCOUNTER — Encounter (HOSPITAL_COMMUNITY): Payer: Self-pay | Admitting: Emergency Medicine

## 2015-06-13 DIAGNOSIS — Z8659 Personal history of other mental and behavioral disorders: Secondary | ICD-10-CM | POA: Insufficient documentation

## 2015-06-13 DIAGNOSIS — Z8742 Personal history of other diseases of the female genital tract: Secondary | ICD-10-CM | POA: Insufficient documentation

## 2015-06-13 DIAGNOSIS — G5602 Carpal tunnel syndrome, left upper limb: Secondary | ICD-10-CM | POA: Diagnosis not present

## 2015-06-13 DIAGNOSIS — Z79899 Other long term (current) drug therapy: Secondary | ICD-10-CM | POA: Diagnosis not present

## 2015-06-13 DIAGNOSIS — Z792 Long term (current) use of antibiotics: Secondary | ICD-10-CM | POA: Diagnosis not present

## 2015-06-13 DIAGNOSIS — Z8619 Personal history of other infectious and parasitic diseases: Secondary | ICD-10-CM | POA: Insufficient documentation

## 2015-06-13 DIAGNOSIS — F17210 Nicotine dependence, cigarettes, uncomplicated: Secondary | ICD-10-CM | POA: Diagnosis not present

## 2015-06-13 DIAGNOSIS — M25532 Pain in left wrist: Secondary | ICD-10-CM | POA: Diagnosis present

## 2015-06-13 MED ORDER — NAPROXEN 500 MG PO TABS: 500.0000 mg | ORAL_TABLET | Freq: Two times a day (BID) | ORAL | Status: DC

## 2015-06-13 MED ORDER — HYDROCODONE-ACETAMINOPHEN 5-325 MG PO TABS
1.0000 | ORAL_TABLET | Freq: Once | ORAL | Status: AC
  Administered 2015-06-13: 1 via ORAL
  Filled 2015-06-13: qty 1

## 2015-06-13 NOTE — ED Notes (Signed)
Pt reports acute pain in l/wrist x 5 days. Pt denies trauma. Tx with Tylenol daily. Pain increased when lifting heavy objects. C/o popping sensation in wrist

## 2015-06-13 NOTE — ED Provider Notes (Signed)
CSN: [id number]     Arrival date & time 06/13/15  1328 History  By signing my name below, I, Marisue Humble, attest that this documentation has been prepared under the direction and in the presence of non-physician practitioner, Gaylyn Rong PA-C. Electronically Signed: Marisue Humble, Scribe. 06/13/2015. 2:10 PM.   Chief Complaint  Patient presents with  . Wrist Pain    5 day hx of wrist pain, denies trauma   The history is provided by the patient. No language interpreter was used.   HPI Comments:  [name] is a 25 y.o. female who presents to the Emergency Department complaining of throbbing 8/10 left wrist pain onset 5 days ago. She notes a popping sound with movement; pain is worse with range of motion and in the morning. Pt reports associated tingling in her wrist. Pt has taken Tylenol and Ibuprofen every 6 hours without relief. Pt states she has been cleaning and picking up her son, but denies any trauma or injury.  Past Medical History  Diagnosis Date  . Ovarian cyst 2012  . HPV (human papilloma virus) infection   . Shingles   . Vaginal Pap smear, abnormal   . Anxiety    Past Surgical History  Procedure Laterality Date  . Laceration repair      neck  . Cervical biopsy    . Leep N/A 05/13/2015    Procedure: LOOP ELECTROSURGICAL EXCISION PROCEDURE (LEEP);  Surgeon: Carrington Clamp, MD;  Location: [facility];  Service: Gynecology;  Laterality: N/A;  . Cervical conization w/bx N/A 05/13/2015    Procedure: CONIZATION CERVIX WITH BIOPSY;  Surgeon: Carrington Clamp, MD;  Location: [facility];  Service: Gynecology;  Laterality: N/A;   Family History  Problem Relation Age of Onset  . Cancer Mother   . Cancer Maternal Aunt    Social History  Substance Use Topics  . Smoking status: Current Every Day Smoker -- 0.50 packs/day for 10 years    Types: Cigarettes  . Smokeless tobacco: Never Used  . Alcohol Use: 1.8 oz/week    3 Cans of beer per week     Comment: socially beer   OB  History    Gravida Para Term Preterm AB TAB SAB Ectopic Multiple Living   0 0 0 0 0 0 3     Review of Systems  Musculoskeletal: Positive for arthralgias.  Neurological: Positive for numbness (tingling).  All other systems reviewed and are negative.  Allergies  Bee venom and Tramadol  Home Medications   Prior to Admission medications   Medication Sig Start Date End Date Taking? Authorizing Provider  EPINEPHrine (EPIPEN 2-PAK) 0.3 mg/0.3 mL IJ SOAJ injection Inject 0.3 mg into the muscle once as needed (for severe allergic reaction).    Historical Provider, MD  HYDROcodone-acetaminophen (NORCO/VICODIN) 5-325 MG tablet Take 2 tablets by mouth every 4 (four) hours as needed. 05/22/15   Mady Gemma, PA-C  ibuprofen (ADVIL,MOTRIN) 600 MG tablet Take 600 mg by mouth every 6 (six) hours as needed for headache or mild pain.    Historical Provider, MD  levonorgestrel (MIRENA) 20 MCG/24HR IUD 1 each by Intrauterine route once.    Historical Provider, MD  metroNIDAZOLE (FLAGYL) 500 MG tablet Take 1 tablet (500 mg total) by mouth 2 (two) times daily. 05/17/15   Judeth Horn, NP  omeprazole (PRILOSEC) 20 MG capsule Take 1 capsule (20 mg total) by mouth daily. 05/17/15   Judeth Horn, NP  oxyCODONE-acetaminophen (PERCOCET/ROXICET) 5-325 MG tablet Take  1-2 tablets by mouth every 4 (four) hours as needed for severe pain.    Historical Provider, MD  penicillin v potassium (VEETID) 500 MG tablet Take 1 tablet (500 mg total) by mouth 4 (four) times daily. 05/22/15   Mady GemmaElizabeth C Westfall, PA-C   BP 118/67 mmHg  Pulse 83  Temp(Src) 98.4 F (36.9 C) (Oral)  Resp 18  Wt 116 lb (52.617 kg)  SpO2 99%  LMP 04/09/2015  Breastfeeding? No Physical Exam  Constitutional: She is oriented to person, place, and time. She appears well-developed and well-nourished. No distress.  HENT:  Head: Normocephalic and atraumatic.  Eyes: Conjunctivae are normal. Right eye exhibits no discharge. Left eye exhibits  no discharge. No scleral icterus.  Cardiovascular: Normal rate.   Pulmonary/Chest: Effort normal.  Musculoskeletal:  Left Wrist: Pain felt with wrist flexion, positive Finkelstein's test; negative Allen's test; no obvious bony deformity; intact distal pulses; non-tender to palpation; no sensory deficits  Neurological: She is alert and oriented to person, place, and time. Coordination normal.  Skin: Skin is warm and dry. No rash noted. She is not diaphoretic. No erythema. No pallor.  Psychiatric: She has a normal mood and affect. Her behavior is normal.  Nursing note and vitals reviewed.   ED Course  Procedures  DIAGNOSTIC STUDIES:  Oxygen Saturation is 99% on RA, normal by my interpretation.    COORDINATION OF CARE:  2:05 PM Will apply brace to left wrist and prescribe extra strength Naproxen. Recommended follow-up with hand specialist if pain does not improve in a week. Discussed treatment plan with pt at bedside and pt agreed to plan.  Labs Review Labs Reviewed - No data to display  Imaging Review No results found. I have personally reviewed and evaluated these images and lab results as part of my medical decision-making.   EKG Interpretation None      MDM   Final diagnoses:  Carpal tunnel syndrome of left wrist   Otherwise healthy 25 y.o F presents to the ED complaining of atraumatic left wrist pain. Based on history and exam symptoms are consistent with carpal tunnel syndrome. Do not feel x-rays indicated at this time given no obvious bony deformity and lack of injury. Recommend brace which was given to the patient run the ED as well as anti-inflammatories. If symptoms aren't improved. With orthopedic hand surgeon, referral given. Return precautions outlined in patient discharge instructions.     I personally performed the services described in this documentation, which was scribed in my presence. The recorded information has been reviewed and is accurate.      Lester KinsmanSamantha Tripp BabbittDowless, PA-C 06/13/15 1433  Lorre NickAnthony Allen, MD 06/21/15 [phone]

## 2015-06-13 NOTE — Discharge Instructions (Signed)
Carpal Tunnel Syndrome Carpal tunnel syndrome is a condition that causes pain in your hand and arm. The carpal tunnel is a narrow area located on the palm side of your wrist. Repeated wrist motion or certain diseases may cause swelling within the tunnel. This swelling pinches the main nerve in the wrist (median nerve). CAUSES  This condition may be caused by:   Repeated wrist motions.  Wrist injuries.  Arthritis.  A cyst or tumor in the carpal tunnel.  Fluid buildup during pregnancy. Sometimes the cause of this condition is not known.  RISK FACTORS This condition is more likely to develop in:   People who have jobs that cause them to repeatedly move their wrists in the same motion, such as butchers and cashiers.  Women.  People with certain conditions, such as:  Diabetes.  Obesity.  An underactive thyroid (hypothyroidism).  Kidney failure. SYMPTOMS  Symptoms of this condition include:   A tingling feeling in your fingers, especially in your thumb, index, and middle fingers.  Tingling or numbness in your hand.  An aching feeling in your entire arm, especially when your wrist and elbow are bent for long periods of time.  Wrist pain that goes up your arm to your shoulder.  Pain that goes down into your palm or fingers.  A weak feeling in your hands. You may have trouble grabbing and holding items. Your symptoms may feel worse during the night.  DIAGNOSIS  This condition is diagnosed with a medical history and physical exam. You may also have tests, including:   An electromyogram (EMG). This test measures electrical signals sent by your nerves into the muscles.  X-rays. TREATMENT  Treatment for this condition includes:  Lifestyle changes. It is important to stop doing or modify the activity that caused your condition.  Physical or occupational therapy.  Medicines for pain and inflammation. This may include medicine that is injected into your wrist.  A wrist  splint.  Surgery. HOME CARE INSTRUCTIONS  If You Have a Splint:  Wear it as told by your health care provider. Remove it only as told by your health care provider.  Loosen the splint if your fingers become numb and tingle, or if they turn cold and blue.  Keep the splint clean and dry. General Instructions  Take over-the-counter and prescription medicines only as told by your health care provider.  Rest your wrist from any activity that may be causing your pain. If your condition is work related, talk to your employer about changes that can be made, such as getting a wrist pad to use while typing.  If directed, apply ice to the painful area:  Put ice in a plastic bag.  Place a towel between your skin and the bag.  Leave the ice on for 20 minutes, 2-3 times per day.  Keep all follow-up visits as told by your health care provider. This is important.  Do any exercises as told by your health care provider, physical therapist, or occupational therapist. SEEK MEDICAL CARE IF:   You have new symptoms.  Your pain is not controlled with medicines.  Your symptoms get worse.   This information is not intended to replace advice given to you by your health care provider. Make sure you discuss any questions you have with your health care provider.   Keep wrist in brace as much as possible. Apply ice to affected area. Take naproxen as prescribed for pain and inflammation. If her symptoms don't improve in 1-2 weeks  please contact the orthopedic provider to schedule an appointment. Return to the ED if you experience any worsening of her symptoms, increased redness or swelling around your wrist, fevers, chills.

## 2015-08-19 ENCOUNTER — Encounter (HOSPITAL_COMMUNITY): Payer: Self-pay | Admitting: *Deleted

## 2015-08-19 ENCOUNTER — Emergency Department (HOSPITAL_COMMUNITY)
Admission: EM | Admit: 2015-08-19 | Discharge: 2015-08-19 | Disposition: A | Discharge status: Home / Self Care | Payer: Medicaid Other | Attending: Emergency Medicine | Admitting: Emergency Medicine

## 2015-08-19 DIAGNOSIS — Z791 Long term (current) use of non-steroidal anti-inflammatories (NSAID): Secondary | ICD-10-CM | POA: Insufficient documentation

## 2015-08-19 DIAGNOSIS — M545 Low back pain: Secondary | ICD-10-CM

## 2015-08-19 DIAGNOSIS — J029 Acute pharyngitis, unspecified: Secondary | ICD-10-CM | POA: Diagnosis present

## 2015-08-19 DIAGNOSIS — J069 Acute upper respiratory infection, unspecified: Secondary | ICD-10-CM

## 2015-08-19 DIAGNOSIS — F17210 Nicotine dependence, cigarettes, uncomplicated: Secondary | ICD-10-CM | POA: Diagnosis not present

## 2015-08-19 DIAGNOSIS — Z79899 Other long term (current) drug therapy: Secondary | ICD-10-CM | POA: Diagnosis not present

## 2015-08-19 DIAGNOSIS — M5440 Lumbago with sciatica, unspecified side: Secondary | ICD-10-CM | POA: Insufficient documentation

## 2015-08-19 LAB — RAPID STREP SCREEN (MED CTR MEBANE ONLY): STREPTOCOCCUS, GROUP A SCREEN (DIRECT): NEGATIVE

## 2015-08-19 MED ORDER — FLUTICASONE PROPIONATE 50 MCG/ACT NA SUSP: 1.0000 | Freq: Every day | NASAL | Status: DC

## 2015-08-19 MED ORDER — CETIRIZINE HCL 5 MG PO TABS: 5.0000 mg | ORAL_TABLET | Freq: Every day | ORAL | Status: DC

## 2015-08-19 MED ORDER — IBUPROFEN 200 MG PO TABS
400.0000 mg | ORAL_TABLET | Freq: Once | ORAL | Status: AC
  Administered 2015-08-19: 400 mg via ORAL
  Filled 2015-08-19: qty 2

## 2015-08-19 MED ORDER — CYCLOBENZAPRINE HCL 5 MG PO TABS: 5.0000 mg | ORAL_TABLET | Freq: Three times a day (TID) | ORAL | Status: DC | PRN

## 2015-08-19 NOTE — ED Notes (Signed)
PT DISCHARGED. INSTRUCTIONS AND PRESCRIPTIONS GIVEN. AAOX4. PT IN NO APPARENT DISTRESS. THE OPPORTUNITY TO ASK QUESTIONS WAS PROVIDED. 

## 2015-08-19 NOTE — ED Notes (Signed)
Patient c/o sore throat, left ear pain, sinus and gum pain since yesterday.  Patient also c/o non-productive cough.  Pt had a temperature of 100.1 yesterday and treated it with ibuprofen.

## 2015-08-19 NOTE — ED Provider Notes (Signed)
CSN: [id number]     Arrival date & time 08/19/15  4098 History   First MD Initiated Contact with Patient 08/19/15 1144     Chief Complaint  Patient presents with  . URI     (Consider location/radiation/quality/duration/timing/severity/associated sxs/prior Treatment) HPI Comments: [name] is a 25 y.o. female presents to ED with complaint of URI sxs. Pt reports URI sxs started approximately 2 days ago. Symptoms include fever (yesterday), sore throat, nasal congestion, sinus pressure, left ear pain, headache, and non-productive cough. Denies fatigue, post nasal drip, eye discharge, myalgias, arthralgias, shortness of breath, chest pain, abdominal pain, N/V, diarrhea, or constipation. She is able to eat and drink as normal; however, painful. She has tried tylenol and ibuprofen with minimal relief.   Of note, pt also endorses low back pain b/l x 1 month. No radiation. No numbness or weakness in extremities. She is able to ambulate. Denies trauma. No loss of bowel or bladder function. No h/o cancer, unexpected weight loss, or IVDU. Attributes pain to lifting 25lb, 8mos baby daily. Denies urinary sxs.     Patient is a 25 y.o. female presenting with URI. The history is provided by the patient and medical records.  URI Presenting symptoms: congestion, cough ( non-productive), ear pain ( left), fever (yesterday), rhinorrhea and sore throat   Presenting symptoms: no fatigue   Associated symptoms: headaches and sneezing     Past Medical History  Diagnosis Date  . Ovarian cyst 2012  . HPV (human papilloma virus) infection   . Shingles   . Vaginal Pap smear, abnormal   . Anxiety    Past Surgical History  Procedure Laterality Date  . Laceration repair      neck  . Cervical biopsy    . Leep N/A 05/13/2015    Procedure: LOOP ELECTROSURGICAL EXCISION PROCEDURE (LEEP);  Surgeon: Carrington Clamp, MD;  Location: [facility];  Service: Gynecology;  Laterality: N/A;  . Cervical conization w/bx N/A  05/13/2015    Procedure: CONIZATION CERVIX WITH BIOPSY;  Surgeon: Carrington Clamp, MD;  Location: [facility];  Service: Gynecology;  Laterality: N/A;   Family History  Problem Relation Age of Onset  . Cancer Mother   . Cancer Maternal Aunt    Social History  Substance Use Topics  . Smoking status: Current Every Day Smoker -- 0.50 packs/day for 10 years    Types: Cigarettes  . Smokeless tobacco: Never Used  . Alcohol Use: 1.8 oz/week    3 Cans of beer per week     Comment: socially beer   OB History    Gravida Para Term Preterm AB TAB SAB Ectopic Multiple Living   0 0 0 0 0 0 3     Review of Systems  Constitutional: Positive for fever (yesterday). Negative for fatigue.  HENT: Positive for congestion, ear pain ( left), rhinorrhea, sinus pressure, sneezing and sore throat. Negative for drooling, postnasal drip and trouble swallowing.   Eyes: Negative for discharge.  Respiratory: Positive for cough ( non-productive). Negative for shortness of breath.   Cardiovascular: Negative for chest pain.  Gastrointestinal: Negative for nausea, vomiting, abdominal pain, diarrhea and constipation.  Genitourinary: Negative for dysuria and hematuria.  Musculoskeletal: Positive for back pain. Negative for gait problem.  Skin: Negative for rash.  Allergic/Immunologic: Negative for immunocompromised state.  Neurological: Positive for headaches. Negative for weakness and numbness.    Allergies  Bee venom and Tramadol  Home Medications   Prior to Admission medications  Medication Sig Start Date End Date Taking? Authorizing Provider  EPINEPHrine (EPIPEN 2-PAK) 0.3 mg/0.3 mL IJ SOAJ injection Inject 0.3 mg into the muscle once as needed (for severe allergic reaction).   Yes Historical Provider, MD  ibuprofen (ADVIL,MOTRIN) 600 MG tablet Take 600 mg by mouth every 6 (six) hours as needed for headache or mild pain.   Yes Historical Provider, MD  levonorgestrel (MIRENA) 20 MCG/24HR IUD 1 each by  Intrauterine route once.   Yes Historical Provider, MD  cetirizine (ZYRTEC) 5 MG tablet Take 1 tablet (5 mg total) by mouth daily. 08/19/15   Lona Kettle, PA-C  cyclobenzaprine (FLEXERIL) 5 MG tablet Take 1 tablet (5 mg total) by mouth 3 (three) times daily as needed for muscle spasms. 08/19/15   Lona Kettle, PA-C  fluticasone [facility]) 50 MCG/ACT nasal spray Place 1 spray into both nostrils daily. 08/19/15   Lona Kettle, PA-C  HYDROcodone-acetaminophen (NORCO/VICODIN) 5-325 MG tablet Take 2 tablets by mouth every 4 (four) hours as needed. Patient not taking: Reported on 08/19/2015 05/22/15   Mady Gemma, PA-C  metroNIDAZOLE (FLAGYL) 500 MG tablet Take 1 tablet (500 mg total) by mouth 2 (two) times daily. Patient not taking: Reported on 08/19/2015 05/17/15   Judeth Horn, NP  naproxen (NAPROSYN) 500 MG tablet Take 1 tablet (500 mg total) by mouth 2 (two) times daily. Patient not taking: Reported on 08/19/2015 06/13/15   Samantha Tripp Dowless, PA-C  omeprazole (PRILOSEC) 20 MG capsule Take 1 capsule (20 mg total) by mouth daily. Patient not taking: Reported on 08/19/2015 05/17/15   Judeth Horn, NP  penicillin v potassium (VEETID) 500 MG tablet Take 1 tablet (500 mg total) by mouth 4 (four) times daily. Patient not taking: Reported on 08/19/2015 05/22/15   Mady Gemma, PA-C   BP 121/68 mmHg  Pulse 87  Temp(Src) 99.5 F (37.5 C) (Oral)  Resp 17  Ht 5\' 1"  (1.549 m)  Wt 51.256 kg  BMI 21.36 kg/m2  SpO2 98% Physical Exam  Constitutional: She appears well-developed and well-nourished. No distress.  HENT:  Head: Normocephalic and atraumatic.  Right Ear: Tympanic membrane, external ear and ear canal normal. No drainage, swelling or tenderness. No foreign bodies. Tympanic membrane is not injected, not perforated, not erythematous and not retracted. No middle ear effusion. No hemotympanum.  Left Ear: External ear and ear canal normal. No drainage, swelling or  tenderness. No foreign bodies. Tympanic membrane is not injected, not perforated, not erythematous and not retracted.  No middle ear effusion. No hemotympanum.  Nose: Right sinus exhibits no maxillary sinus tenderness and no frontal sinus tenderness. Left sinus exhibits no maxillary sinus tenderness and no frontal sinus tenderness.  Mouth/Throat: Uvula is midline and mucous membranes are normal. No trismus in the jaw. No uvula swelling. Posterior oropharyngeal erythema ( mild) present. No oropharyngeal exudate, posterior oropharyngeal edema or tonsillar abscesses.  No swelling of external auditory canal. Mild bulging of left TM. No middle ear effusion. No injection or erythema. Mild erythema of posterior oropharynx. No tonsillar hypertrophy or exudate. No sublingual swelling.   Eyes: Conjunctivae and EOM are normal. Pupils are equal, round, and reactive to light. Right eye exhibits no discharge. Left eye exhibits no discharge. No scleral icterus.  Neck: Normal range of motion and phonation normal. Neck supple. No rigidity. Normal range of motion present.  Cardiovascular: Normal rate, regular rhythm, normal heart sounds and intact distal pulses.   No murmur heard. Pulmonary/Chest: Effort normal and breath sounds normal.  No stridor. No respiratory distress.  Abdominal: Soft. Bowel sounds are normal. There is no tenderness. There is no rebound, no guarding and no CVA tenderness.  Musculoskeletal: Normal range of motion.  No TTP of C-, T-, L-spine. No step off. No rashes or deformities noted. No TTP of paravertebral muscles.   Lymphadenopathy:    She has no cervical adenopathy.  Neurological: She is alert. Coordination normal. GCS eye subscore is 4. GCS verbal subscore is 5. GCS motor subscore is 6.  Mental Status:  Alert, thought content appropriate, able to give a coherent history. Speech fluent without evidence of aphasia. Able to follow 2 step commands without difficulty.  Cranial Nerves:  II:   Peripheral visual fields grossly normal, pupils equal, round, reactive to light III,IV, VI: ptosis not present, extra-ocular motions intact bilaterally  V,VII: smile symmetric, facial light touch sensation equal VIII: hearing grossly normal to voice  X: uvula elevates symmetrically  XI: bilateral shoulder shrug symmetric and strong XII: midline tongue extension without fassiculations Motor:  Normal tone. 5/5 in upper and lower extremities bilaterally including strong and equal grip strength and dorsiflexion/plantar flexion Sensory: light touch normal in all extremities.  DTRs: unable to obtain b/l lower extremity DTR Cerebellar: normal finger-to-nose with bilateral upper extremities Gait: normal gait and balance CV: distal pulses palpable throughout    Skin: Skin is warm and dry. She is not diaphoretic.  Psychiatric: She has a normal mood and affect.    ED Course  Procedures (including critical care time) Labs Review Labs Reviewed  [facility])  CULTURE, GROUP A STREP [facility])    Imaging Review No results found. I have personally reviewed and evaluated these images and lab results as part of my medical decision-making.   EKG Interpretation None      MDM   Final diagnoses:  URI (upper respiratory infection)  Bilateral low back pain, with sciatica presence unspecified    Presents with sore throat, ear pain, gum pain, and back pain. Patient is afebrile and non-toxic appearing in NAD. Vital signs are stable. She is managing her oral secretions. Physical exam remarkable for mild erythema to posterior pharynx. No trismus. Uvula midline. No nuchal rigidity. Lungs are clear to auscultation. Left TM mildly bulging; however, without erythema, injection or middle ear effusion. No TTP of sinuses. Presentation non-concerning for PTA or infxn spread to soft tissue. Rapid strep negative, will culture. Suspect URI vs. Viral pharyngitis. At this time, pt does not appear to  have acute otitis media or otitis externa. No abx indicated at this time.   Patient with back pain. No neurological deficits and normal neuro exam. Patient is ambulatory.  No loss of bowel or bladder control. Low suspicion for cauda equina. No weight loss, h/o cancer, or IVDU. She is afebrile in ED and no TTP of spine. Pt declined to provide a urine sample stating she is "not pregnant, I have an IUD" and she "does not have a UTI." Discussed symptomatic management.  Pt requesting something for pain. Offered motrin and tylenol, patient refused stating "I've tried those and they don't work." Offered viscous lidocaine for throat and gum pain, pt refused stating "I don't want my f**king mouth numb, I want pain relief." Offered pt muscle relaxer for back pain, pt refused. Pt requesting to leave if I "can't give anything else for pain."   Discussed with patient clinical course of a URI. Discussed symptomatic management including tylenol/motrin for pain, cepacol drops and warm salt water  gargles. Provided Rx for flonase, zyrtec, and muscle relaxer. Encouraged follow up with PCP if symptoms do not improve in next 7 days. Provided return precautions.    Lona Kettle, PA-C 08/19/15 2021  Tilden Fossa, MD 08/20/15 [phone]

## 2015-08-19 NOTE — Discharge Instructions (Signed)
Read the information below.   Your rapid strep was negative. It is being sent for culture. If positive you will be notified. You can use flonase for nasal congestion. Zyrtec for relief of nasal symptoms. You can also use cepacol throat drops for relief of sore throat. Gargle with warm salt water for gum and sore throat relief.  You can take tylenol or ibuprofen for pain and inflammation control.  I have provided a prescription for muscle relaxer as needed for back pain. It can make you drowsy, do not drive or operate machinery while taking.  Use the prescribed medication as directed.  Please discuss all new medications with your pharmacist.   Follow up with a primary care provider if your symptoms do not improve in the next 7-10 days as this may indicate a secondary bacterial infection requiring antibiotics. I have provided contact information for [facility].  You may return to the Emergency Department at any time for worsening condition or any new symptoms that concern you. Return to ED if your symptoms worsen of you develop fever, difficulty swallowing, difficulty breathing, numbness/weakness in your extremities, loss of bowel or bladder function, or inability to walk.    Back Injury Prevention Back injuries can be very painful. They can also be difficult to heal. After having one back injury, you are more likely to injure your back again. It is important to learn how to avoid injuring or re-injuring your back. The following tips can help you to prevent a back injury. WHAT SHOULD I KNOW ABOUT PHYSICAL FITNESS?  Exercise for 30 minutes per day on most days of the week or as told by your doctor. Make sure to:  Do aerobic exercises, such as walking, jogging, biking, or swimming.  Do exercises that increase balance and strength, such as tai chi and yoga.  Do stretching exercises. This helps with flexibility.  Try to develop strong belly (abdominal) muscles. Your belly  muscles help to support your back.  Stay at a healthy weight. This helps to decrease your risk of a back injury. WHAT SHOULD I KNOW ABOUT MY DIET?  Talk with your doctor about your overall diet. Take supplements and vitamins only as told by your doctor.  Talk with your doctor about how much calcium and vitamin D you need each day. These nutrients help to prevent weakening of the bones (osteoporosis).  Include good sources of calcium in your diet, such as:  Dairy products.  Green leafy vegetables.  Products that have had calcium added to them (fortified).  Include good sources of vitamin D in your diet, such as:  Milk.  Foods that have had vitamin D added to them. WHAT SHOULD I KNOW ABOUT MY POSTURE?  Sit up straight and stand up straight. Avoid leaning forward when you sit or hunching over when you stand.  Choose chairs that have good low-back (lumbar) support.  If you work at a desk, sit close to it so you do not need to lean over. Keep your chin tucked in. Keep your neck drawn back. Keep your elbows bent so your arms look like the letter "L" (right angle).  Sit high and close to the steering wheel when you drive. Add a low-back support to your car seat, if needed.  Avoid sitting or standing in one position for very long. Take breaks to get up, stretch, and walk around at least one time every hour. Take breaks every hour if you are driving for long periods of  time.  Sleep on your side with your knees slightly bent, or sleep on your back with a pillow under your knees. Do not lie on the front of your body to sleep. WHAT SHOULD I KNOW ABOUT LIFTING, TWISTING, AND REACHING Lifting and Heavy Lifting  Avoid heavy lifting, especially lifting over and over again. If you must do heavy lifting:  Stretch before lifting.  Work slowly.  Rest between lifts.  Use a tool such as a cart or a dolly to move objects if one is available.  Make several small trips instead of carrying one  heavy load.  Ask for help when you need it, especially when moving big objects.  Follow these steps when lifting:  Stand with your feet shoulder-width apart.  Get as close to the object as you can. Do not pick up a heavy object that is far from your body.  Use handles or lifting straps if they are available.  Bend at your knees. Squat down, but keep your heels off the floor.  Keep your shoulders back. Keep your chin tucked in. Keep your back straight.  Lift the object slowly while you tighten the muscles in your legs, belly, and butt. Keep the object as close to the center of your body as possible.  Follow these steps when putting down a heavy load:  Stand with your feet shoulder-width apart.  Lower the object slowly while you tighten the muscles in your legs, belly, and butt. Keep the object as close to the center of your body as possible.  Keep your shoulders back. Keep your chin tucked in. Keep your back straight.  Bend at your knees. Squat down, but keep your heels off the floor.  Use handles or lifting straps if they are available. Twisting and Reaching  Avoid lifting heavy objects above your waist.  Do not twist at your waist while you are lifting or carrying a load. If you need to turn, move your feet.  Do not bend over without bending at your knees.  Avoid reaching over your head, across a table, or for an object on a high surface.  WHAT ARE SOME OTHER TIPS?  Avoid wet floors and icy ground. Keep sidewalks clear of ice to prevent falls.   Do not sleep on a mattress that is too soft or too hard.   Keep items that you use often within easy reach.   Put heavier objects on shelves at waist level, and put lighter objects on lower or higher shelves.  Find ways to lower your stress, such as:  Exercise.  Massage.  Relaxation techniques.  Talk with your doctor if you feel anxious or depressed. These conditions can make back pain worse.  Wear flat heel  shoes with cushioned soles.  Avoid making quick (sudden) movements.  Use both shoulder straps when carrying a backpack.  Do not use any tobacco products, including cigarettes, chewing tobacco, or electronic cigarettes. If you need help quitting, ask your doctor.   This information is not intended to replace advice given to you by your health care provider. Make sure you discuss any questions you have with your health care provider.   Document Released: 07/14/2007 Document Revised: 06/11/2014 Document Reviewed: 01/29/2014 Elsevier Interactive Patient Education 2016 [employer]  Upper Respiratory Infection, Adult Most upper respiratory infections (URIs) are caused by a virus. A URI affects the nose, throat, and upper air passages. The most common type of URI is often called "the common cold." HOME CARE  Take medicines only as told by your doctor.  Gargle warm saltwater or take cough drops to comfort your throat as told by your doctor.  Use a warm mist humidifier or inhale steam from a shower to increase air moisture. This may make it easier to breathe.  Drink enough fluid to keep your pee (urine) clear or pale yellow.  Eat soups and other clear broths.  Have a healthy diet.  Rest as needed.  Go back to work when your fever is gone or your doctor says it is okay.  You may need to stay home longer to avoid giving your URI to others.  You can also wear a face mask and wash your hands often to prevent spread of the virus.  Use your inhaler more if you have asthma.  Do not use any tobacco products, including cigarettes, chewing tobacco, or electronic cigarettes. If you need help quitting, ask your doctor. GET HELP IF:  You are getting worse, not better.  Your symptoms are not helped by medicine.  You have chills.  You are getting more short of breath.  You have brown or red mucus.  You have yellow or brown discharge from your nose.  You have pain in your face,  especially when you bend forward.  You have a fever.  You have puffy (swollen) neck glands.  You have pain while swallowing.  You have white areas in the back of your throat. GET HELP RIGHT AWAY IF:   You have very bad or constant:  Headache.  Ear pain.  Pain in your forehead, behind your eyes, and over your cheekbones (sinus pain).  Chest pain.  You have long-lasting (chronic) lung disease and any of the following:  Wheezing.  Long-lasting cough.  Coughing up blood.  A change in your usual mucus.  You have a stiff neck.  You have changes in your:  Vision.  Hearing.  Thinking.  Mood. MAKE SURE YOU:   Understand these instructions.  Will watch your condition.  Will get help right away if you are not doing well or get worse.   This information is not intended to replace advice given to you by your health care provider. Make sure you discuss any questions you have with your health care provider.   Document Released: 07/14/2007 Document Revised: 06/11/2014 Document Reviewed: 05/02/2013 Elsevier Interactive Patient Education [employer].

## 2015-08-19 NOTE — ED Notes (Signed)
PT REFUSED TO GIVE A URINE SAMPLE. PT STATES, "I'M NOT PREGNANT, I HAVE AN IUD! I'VE BEEN HERE SINCE 9AM AND IF TYLENOL AND IBUPROFEN WAS HELPING MY PAIN I WOULD NOT BE HERE! I'M HURTING BAD. I WANT TO RIP MY FUCKING TEETH OUT! IF YOU GUYS ARE NOT GOING TO TREAT MY PAIN, I'M GOING TO WALK OUT OF HERE AND MEDICATE MYSELF!" PA ASHLEY MADE AWARE.

## 2015-08-21 LAB — CULTURE, GROUP A STREP (THRC)

## 2015-11-16 ENCOUNTER — Encounter (HOSPITAL_COMMUNITY): Payer: Self-pay

## 2015-11-16 ENCOUNTER — Inpatient Hospital Stay (HOSPITAL_COMMUNITY): Payer: Medicaid Other

## 2015-11-16 ENCOUNTER — Ambulatory Visit (HOSPITAL_COMMUNITY)
Admission: EM | Admit: 2015-11-16 | Discharge: 2015-11-16 | Disposition: A | Discharge status: Home / Self Care | Payer: Medicaid Other | Attending: Internal Medicine | Admitting: Internal Medicine

## 2015-11-16 ENCOUNTER — Encounter (HOSPITAL_COMMUNITY): Payer: Self-pay | Admitting: Certified Nurse Midwife

## 2015-11-16 ENCOUNTER — Inpatient Hospital Stay (HOSPITAL_COMMUNITY)
Admission: AD | Admit: 2015-11-16 | Discharge: 2015-11-16 | Disposition: A | Discharge status: Home / Self Care | Payer: Medicaid Other | Source: Ambulatory Visit | Attending: Obstetrics and Gynecology | Admitting: Obstetrics and Gynecology

## 2015-11-16 DIAGNOSIS — N8300 Follicular cyst of ovary, unspecified side: Secondary | ICD-10-CM | POA: Insufficient documentation

## 2015-11-16 DIAGNOSIS — F419 Anxiety disorder, unspecified: Secondary | ICD-10-CM | POA: Diagnosis not present

## 2015-11-16 DIAGNOSIS — R1030 Lower abdominal pain, unspecified: Secondary | ICD-10-CM | POA: Diagnosis present

## 2015-11-16 DIAGNOSIS — Z888 Allergy status to other drugs, medicaments and biological substances status: Secondary | ICD-10-CM | POA: Diagnosis not present

## 2015-11-16 DIAGNOSIS — Z975 Presence of (intrauterine) contraceptive device: Secondary | ICD-10-CM | POA: Insufficient documentation

## 2015-11-16 DIAGNOSIS — Z79899 Other long term (current) drug therapy: Secondary | ICD-10-CM | POA: Insufficient documentation

## 2015-11-16 DIAGNOSIS — Z9889 Other specified postprocedural states: Secondary | ICD-10-CM | POA: Diagnosis not present

## 2015-11-16 DIAGNOSIS — F17210 Nicotine dependence, cigarettes, uncomplicated: Secondary | ICD-10-CM | POA: Diagnosis not present

## 2015-11-16 DIAGNOSIS — R109 Unspecified abdominal pain: Secondary | ICD-10-CM

## 2015-11-16 DIAGNOSIS — R1084 Generalized abdominal pain: Secondary | ICD-10-CM | POA: Diagnosis not present

## 2015-11-16 LAB — URINALYSIS, ROUTINE W REFLEX MICROSCOPIC
Bilirubin Urine: NEGATIVE
Glucose, UA: NEGATIVE mg/dL
Hgb urine dipstick: NEGATIVE
Ketones, ur: NEGATIVE mg/dL
LEUKOCYTES UA: NEGATIVE
Nitrite: NEGATIVE
PH: 7 (ref 5.0–8.0)
Protein, ur: NEGATIVE mg/dL
SPECIFIC GRAVITY, URINE: 1.015 (ref 1.005–1.030)

## 2015-11-16 LAB — WET PREP, GENITAL
Clue Cells Wet Prep HPF POC: NONE SEEN
Sperm: NONE SEEN
TRICH WET PREP: NONE SEEN
YEAST WET PREP: NONE SEEN

## 2015-11-16 LAB — RAPID URINE DRUG SCREEN, HOSP PERFORMED
AMPHETAMINES: NOT DETECTED
BARBITURATES: NOT DETECTED
BENZODIAZEPINES: NOT DETECTED
Cocaine: NOT DETECTED
Opiates: POSITIVE — AB
TETRAHYDROCANNABINOL: POSITIVE — AB

## 2015-11-16 LAB — POCT PREGNANCY, URINE: PREG TEST UR: NEGATIVE

## 2015-11-16 LAB — CBC
HEMATOCRIT: 34.4 % — AB (ref 36.0–46.0)
HEMOGLOBIN: 11.7 g/dL — AB (ref 12.0–15.0)
MCH: 31 pg (ref 26.0–34.0)
MCHC: 34 g/dL (ref 30.0–36.0)
MCV: 91.2 fL (ref 78.0–100.0)
PLATELETS: 339 10*3/uL (ref 150–400)
RBC: 3.77 MIL/uL — ABNORMAL LOW (ref 3.87–5.11)
RDW: 13.9 % (ref 11.5–15.5)
WBC: 10.5 10*3/uL (ref 4.0–10.5)

## 2015-11-16 MED ORDER — IBUPROFEN 800 MG PO TABS
800.0000 mg | ORAL_TABLET | Freq: Once | ORAL | Status: DC
  Filled 2015-11-16: qty 1

## 2015-11-16 NOTE — MAU Note (Signed)
Pt refused to sign discharge 
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
## 2015-11-16 NOTE — Discharge Instructions (Signed)

## 2015-11-16 NOTE — Discharge Instructions (Signed)
GO TO THE EMERGENCY DEPARTMENT FOR EVALUATION.

## 2015-11-16 NOTE — ED Provider Notes (Signed)
[facility]    CSN: [id number] Arrival date & time: 11/16/15  1508     History   Chief Complaint Chief Complaint  Patient presents with  . Abdominal Pain    HPI [name] is a 25 y.o. female.   HPI Patient comes in to the urgent care requesting something for abdominal pain.  Patient was just discharged from the [facility] ED a couple of hours ago and worked up for abd pain.  Was advised if abd pain not better to return back to the ED.  Symptoms obviously unchanged since her discharge.   Past Medical History:  Diagnosis Date  . Anxiety   . HPV (human papilloma virus) infection   . Ovarian cyst 2012  . Shingles   . Vaginal Pap smear, abnormal     Patient Active Problem List   Diagnosis Date Noted  . Complication of intrauterine device (IUD) (HCC) 03/13/2015  . Pelvic pain in female 03/13/2015  . Postpartum state 12/23/2014    Past Surgical History:  Procedure Laterality Date  . CERVICAL BIOPSY    . CERVICAL CONIZATION W/BX N/A 05/13/2015   Procedure: CONIZATION CERVIX WITH BIOPSY;  Surgeon: Carrington ClampMichelle Horvath, MD;  Location: [facility];  Service: Gynecology;  Laterality: N/A;  . LACERATION REPAIR     neck  . LEEP N/A 05/13/2015   Procedure: LOOP ELECTROSURGICAL EXCISION PROCEDURE (LEEP);  Surgeon: Carrington ClampMichelle Horvath, MD;  Location: [facility];  Service: Gynecology;  Laterality: N/A;    OB History    Gravida Para Term Preterm AB Living   3 3 3  0 0 3   SAB TAB Ectopic Multiple Live Births   0 0 0 0 1       Home Medications    Prior to Admission medications   Medication Sig Start Date End Date Taking? Authorizing Provider  EPINEPHrine (EPIPEN 2-PAK) 0.3 mg/0.3 mL IJ SOAJ injection Inject 0.3 mg into the muscle once as needed (for severe allergic reaction).    Historical Provider, MD  levonorgestrel (MIRENA) 20 MCG/24HR IUD 1 each by Intrauterine route once.    Historical Provider, MD    Family History Family History  Problem Relation Age of Onset  .  Cancer Mother   . Cancer Maternal Aunt     Social History Social History  Substance Use Topics  . Smoking status: Current Every Day Smoker    Packs/day: 0.50    Years: 10.00    Types: Cigarettes  . Smokeless tobacco: Never Used  . Alcohol use 1.8 oz/week    3 Cans of beer per week     Comment: socially beer     Allergies   Bee venom and Tramadol   Review of Systems Review of Systems  Constitutional: Positive for activity change.  Gastrointestinal: Positive for abdominal pain.     Physical Exam Triage Vital Signs ED Triage Vitals  Enc Vitals Group     BP 11/16/15 1653 107/56     Pulse Rate 11/16/15 1653 (!) 58     Resp 11/16/15 1653 16     Temp 11/16/15 1653 98.6 F (37 C)     Temp Source 11/16/15 1653 Oral     SpO2 11/16/15 1653 100 %     Weight --      Height --      Head Circumference --      Peak Flow --      Pain Score 11/16/15 1655 8     Pain Loc --  Pain Edu? --      Excl. in GC? --    No data found.   Updated Vital Signs BP 107/56 (BP Location: Right Arm)   Pulse (!) 58   Temp 98.6 F (37 C) (Oral)   Resp 16   SpO2 100%   Visual Acuity Right Eye Distance:   Left Eye Distance:   Bilateral Distance:    Right Eye Near:   Left Eye Near:    Bilateral Near:     Physical Exam  Constitutional: She appears distressed.     UC Treatments / Results  Labs (all labs ordered are listed, but only abnormal results are displayed) Labs Reviewed - No data to display  EKG  EKG Interpretation None       Radiology US Pelvis Complete  Result Date: 11/16/2015 CLINICAL DATA:  Cramping. EXAM: TRANSABDOMINAL ULTRASOUND OF PELVIS TECHNIQUE: Transabdominal ultrasound examination of the pelvis was performed including evaluation of the uterus, ovaries, adnexal regions, and pelvic cul-de-sac. COMPARISON:  None. FINDINGS: Uterus Measurements: 8.6 x 4.3 x 4.8 cm. No fibroids or other mass visualized. Endometrium There is an IUD within the endometrial  canal. An endometrial stripe thickness was not measured but there is no gross thickening. Right ovary Measurements: 2.5 x 2.5 x 1.5 cm. Normal appearance/no adnexal mass. Left ovary Measurements: 3.7 x 2.5 x 2.4 cm. There is a dominant follicle in the left ovary. Other findings:  No abnormal free fluid. IMPRESSION: The study is limited as the patient refused endovaginal imaging. An IUD is identified. No acute abnormalities or cause for pain noted. Electronically Signed   By: Gerome Sam III M.D   On: 11/16/2015 14:27    Procedures Procedures (including critical care time)  Medications Ordered in UC Medications - No data to display   Initial Impression / Assessment and Plan / UC Course  I have reviewed the triage vital signs and the nursing notes.  Pertinent labs & imaging results that were available during my care of the patient were reviewed by me and considered in my medical decision making (see chart for details).  Clinical Course      Final Clinical Impressions(s) / UC Diagnoses   Final diagnoses:  Generalized abdominal pain    New Prescriptions New Prescriptions   No medications on file  advised patient that if she is continuing to have severe abdominal pain after discharge from [facility] ED a couple of hours ago that she needs to be seen at the [place] or should return back to the [facility] ED.  Advised there isn't much that we can do here and will not be providing narcotic medication. She voices understanding.    Naida Sleight, PA-C 11/16/15 1728

## 2015-11-16 NOTE — ED Triage Notes (Signed)
Pt complains of abdominal pain since last night and said she was seen in the ED previously and ordered an ultrasound and was instructed to f/u if symptoms got worse. Has taking tylenol and motrin without relief.

## 2015-11-16 NOTE — MAU Note (Signed)
Pt presents to MAU with complaints of lower abdominal cramping since last night after intercourse. Denies any vaginal bleeding or abnormal discharge

## 2015-11-17 LAB — GC/CHLAMYDIA PROBE AMP (~~LOC~~) NOT AT ARMC
CHLAMYDIA, DNA PROBE: NEGATIVE
NEISSERIA GONORRHEA: NEGATIVE

## 2015-11-17 LAB — HIV ANTIBODY (ROUTINE TESTING W REFLEX): HIV SCREEN 4TH GENERATION: NONREACTIVE

## 2015-12-17 ENCOUNTER — Encounter (HOSPITAL_COMMUNITY): Payer: Self-pay | Admitting: Family Medicine

## 2015-12-17 ENCOUNTER — Emergency Department (HOSPITAL_COMMUNITY)
Admission: EM | Admit: 2015-12-17 | Discharge: 2015-12-18 | Disposition: A | Discharge status: Home / Self Care | Payer: Medicaid Other | Attending: Emergency Medicine | Admitting: Emergency Medicine

## 2015-12-17 DIAGNOSIS — R591 Generalized enlarged lymph nodes: Secondary | ICD-10-CM | POA: Insufficient documentation

## 2015-12-17 DIAGNOSIS — F17210 Nicotine dependence, cigarettes, uncomplicated: Secondary | ICD-10-CM | POA: Diagnosis not present

## 2015-12-17 MED ORDER — CLINDAMYCIN HCL 300 MG PO CAPS: 300.0000 mg | ORAL_CAPSULE | Freq: Four times a day (QID) | ORAL | 0 refills | Status: DC

## 2015-12-17 NOTE — ED Triage Notes (Signed)
Patient's left lymph node at her jaw line is swollen. Started 2 weeks ago.

## 2015-12-17 NOTE — ED Provider Notes (Signed)
[facility] Provider Note   CSN: [id number] Arrival date & time: 12/17/15  2323  By signing my name below, I, Emmanuella Mensah, attest that this documentation has been prepared under the direction and in the presence of Zadie Rhineonald Journiee Feldkamp, MD. Electronically Signed: Angelene GiovanniEmmanuella Mensah, ED Scribe. 12/18/15. 12:03 AM.   History   Chief Complaint Chief Complaint  Patient presents with  . Lymphadenopathy    HPI Comments: [name] is a 25 y.o. female  who presents to the Emergency Department complaining of gradually worsening moderately painful area of swelling to her left jaw onset 2 weeks ago. She reports associated pain to her gums, pain with swallowing, intermittent episodes of moderate headaches, left ear pain, and anxiety. She states that she is also experiencing abdominal pain but attributes that to her hx of ovarian cysts. No alleviating factors noted. Pt has not tried any medications PTA. She has an allergy to tramadol; no allergy to antibiotics. She denies any fever, chills, hearing loss, trouble swallowing, neck stiffness, nausea, vomiting, or any other symptoms.    The history is provided by the patient. No language interpreter was used.    Past Medical History:  Diagnosis Date  . Anxiety   . HPV (human papilloma virus) infection   . Ovarian cyst 2012  . Shingles   . Vaginal Pap smear, abnormal     Patient Active Problem List   Diagnosis Date Noted  . Complication of intrauterine device (IUD) (HCC) 03/13/2015  . Pelvic pain in female 03/13/2015  . Postpartum state 12/23/2014    Past Surgical History:  Procedure Laterality Date  . CERVICAL BIOPSY    . CERVICAL CONIZATION W/BX N/A 05/13/2015   Procedure: CONIZATION CERVIX WITH BIOPSY;  Surgeon: Carrington ClampMichelle Horvath, MD;  Location: [facility];  Service: Gynecology;  Laterality: N/A;  . LACERATION REPAIR     neck  . LEEP N/A 05/13/2015   Procedure: LOOP ELECTROSURGICAL EXCISION PROCEDURE (LEEP);  Surgeon: Carrington ClampMichelle  Horvath, MD;  Location: [facility];  Service: Gynecology;  Laterality: N/A;    OB History    Gravida Para Term Preterm AB Living   3 3 3  0 0 3   SAB TAB Ectopic Multiple Live Births   0 0 0 0 1       Home Medications    Prior to Admission medications   Medication Sig Start Date End Date Taking? Authorizing Provider  EPINEPHrine (EPIPEN 2-PAK) 0.3 mg/0.3 mL IJ SOAJ injection Inject 0.3 mg into the muscle once as needed (for severe allergic reaction).    Historical Provider, MD  levonorgestrel (MIRENA) 20 MCG/24HR IUD 1 each by Intrauterine route once.    Historical Provider, MD    Family History Family History  Problem Relation Age of Onset  . Cancer Mother   . Cancer Maternal Aunt     Social History Social History  Substance Use Topics  . Smoking status: Current Every Day Smoker    Packs/day: 0.50    Years: 10.00    Types: Cigarettes  . Smokeless tobacco: Never Used  . Alcohol use No     Allergies   Bee venom and Tramadol   Review of Systems Review of Systems  Constitutional: Negative for chills and fever.  HENT: Positive for dental problem, ear pain and facial swelling. Negative for hearing loss and trouble swallowing.   Gastrointestinal: Negative for nausea and vomiting.  Musculoskeletal: Negative for neck stiffness.  Neurological: Positive for headaches.  Psychiatric/Behavioral: The patient is nervous/anxious.  Physical Exam Updated Vital Signs BP 97/66 (BP Location: Right Arm)   Pulse 85   Temp 97.5 F (36.4 C) (Oral)   Resp 20   Ht 5\' 1"  (1.549 m)   Wt 110 lb (49.9 kg)   SpO2 100%   BMI 20.78 kg/m   Physical Exam  Nursing note and vitals reviewed.  CONSTITUTIONAL: thin appearing but no acute distress HEAD: Normocephalic/atraumatic EYES: EOMI/PERRL ENMT: Mucous membranes moist; no dental abscess; no trismus; uvula midline without exudates and without edema; bilateral TMs clear and intact NECK: supple no meningeal signs; tender  lymphadenopathy below left mandible; no erythema noted, no fluctuance, no abscess noted SPINE/BACK:entire spine nontender CV: S1/S2 noted, no murmurs/rubs/gallops noted LUNGS: Lungs are clear to auscultation bilaterally, no apparent distress ABDOMEN: soft, nontender NEURO: Pt is awake/alert/appropriate, moves all extremitiesx4.  No facial droop.   EXTREMITIES: pulses normal/equal, full ROM;  LYMPH: no supraclavicular and no axillary lymphadenopathy, there is no posterior cervical lymphadenopathy SKIN: warm, color normal PSYCH: no abnormalities of mood noted, alert and oriented to situation   ED Treatments / Results  DIAGNOSTIC STUDIES: Oxygen Saturation is 100% on RA, normal by my interpretation.    COORDINATION OF CARE: 11:48 PM- Pt advised of plan for treatment and pt agrees. Pt will receive clindamycin and diflucan. Return precautions discussed with trouble swallowing or neck stiffness.    Labs (all labs ordered are listed, but only abnormal results are displayed) Labs Reviewed - No data to display  EKG  EKG Interpretation None       Radiology No results found.  Procedures Procedures (including critical care time)  Medications Ordered in ED Medications - No data to display   Initial Impression / Assessment and Plan / ED Course  Zadie Rhineonald Rhys Lichty, MD has reviewed the triage vital signs and the nursing notes.   Clinical Course      pt with isolated cervical LAD No signs of dental abscess No signs of parotitis Advised that if it does not improve with ABX, she will need recheck for further testing We discussed strict return precautions  Final Clinical Impressions(s) / ED Diagnoses   Final diagnoses:  Lymphadenopathy    New Prescriptions Discharge Medication List as of 12/17/2015 11:59 PM    START taking these medications   Details  clindamycin (CLEOCIN) 300 MG capsule Take 1 capsule (300 mg total) by mouth 4 (four) times daily. X 7 days, Starting Wed  12/17/2015, Print       I personally performed the services described in this documentation, which was scribed in my presence. The recorded information has been reviewed and is accurate.        Zadie Rhineonald Jaylean Buenaventura, MD 12/18/15 [id number]

## 2015-12-17 NOTE — ED Notes (Signed)
Pt complains of 5/10 pain on left side of neck. Pt states the pain is accompanied by headaches and earaches.

## 2015-12-18 MED ORDER — FLUCONAZOLE 150 MG PO TABS: 150.0000 mg | ORAL_TABLET | Freq: Once | ORAL | 0 refills | Status: AC

## 2016-03-30 IMAGING — US US MFM FETAL BPP W/O NON-STRESS
1 series · 15 of 23 positions shown · non-contrast
Comparison: none

[Series 1: us mfm fetal bpp w/o non-stress · 23 acquisitions, 15 frames shown]
[im 1/23]
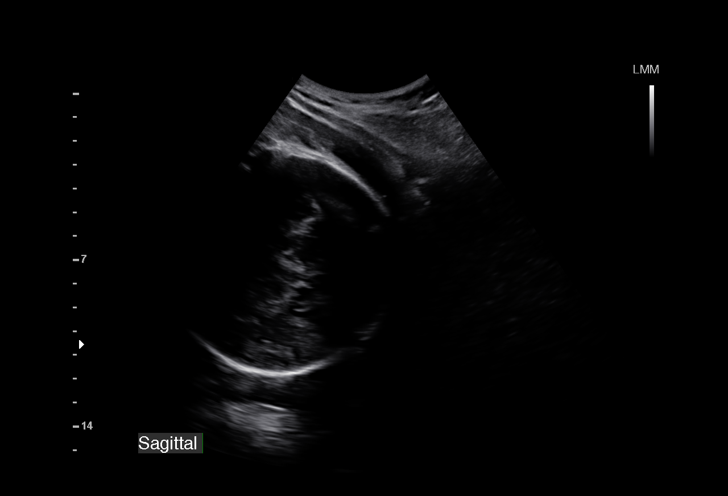
[im 3/23]
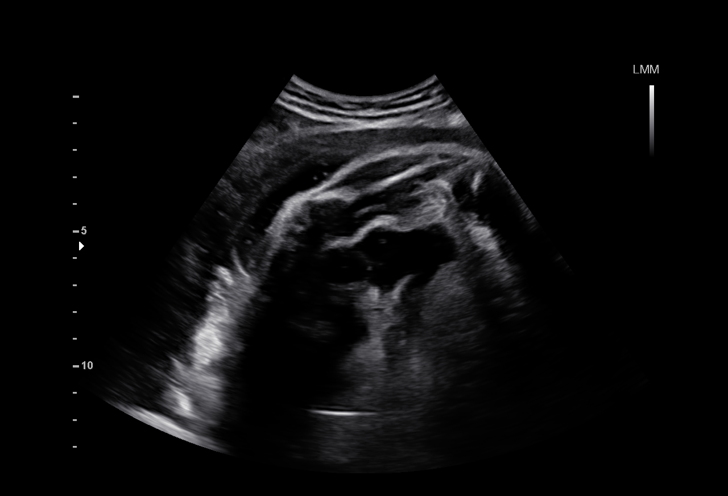
[im 4/23]
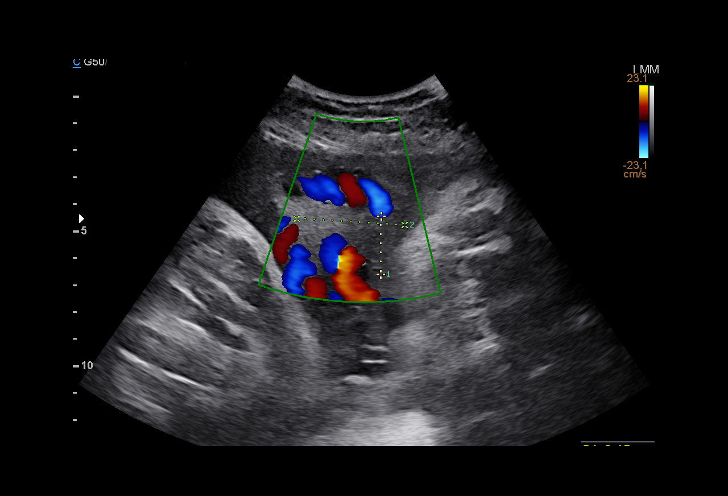
[im 6/23]
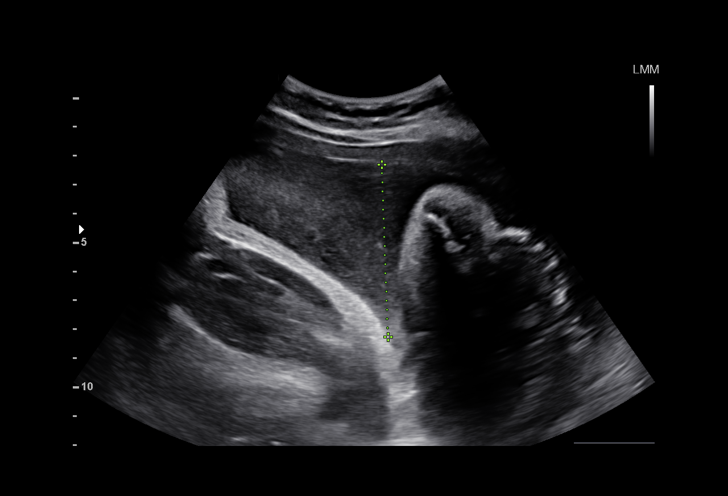
[im 7/23]
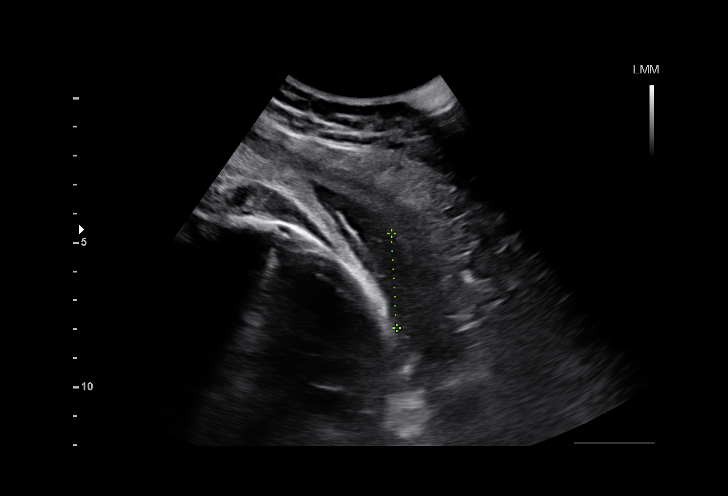
[im 9/23]
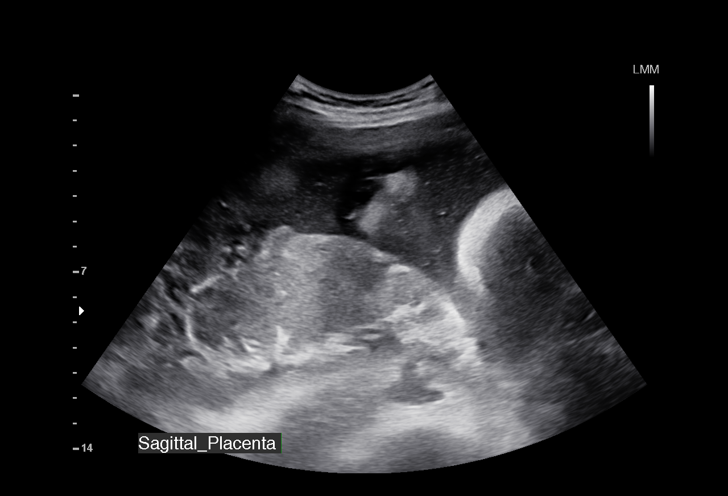
[im 10/23]
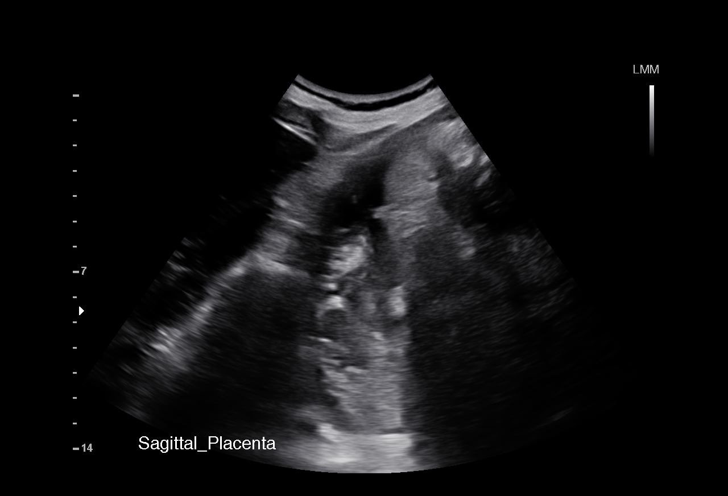
[im 12/23]
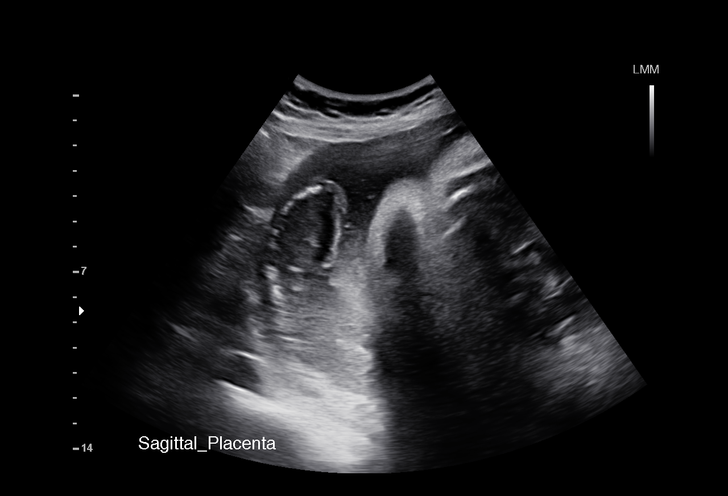
[im 14/23]
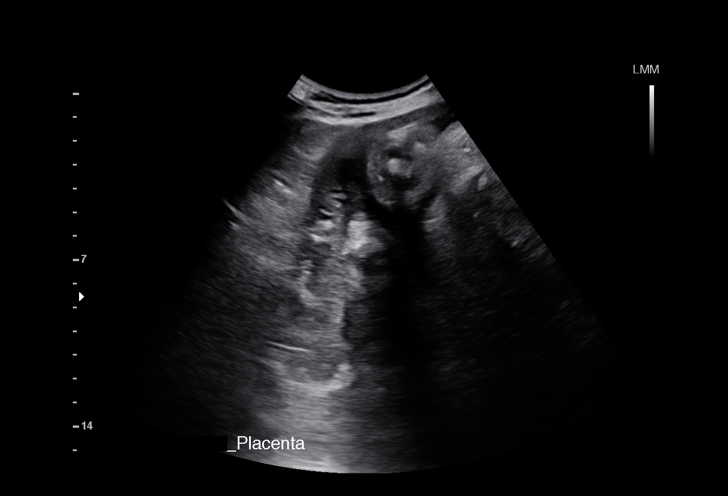
[im 15/23]
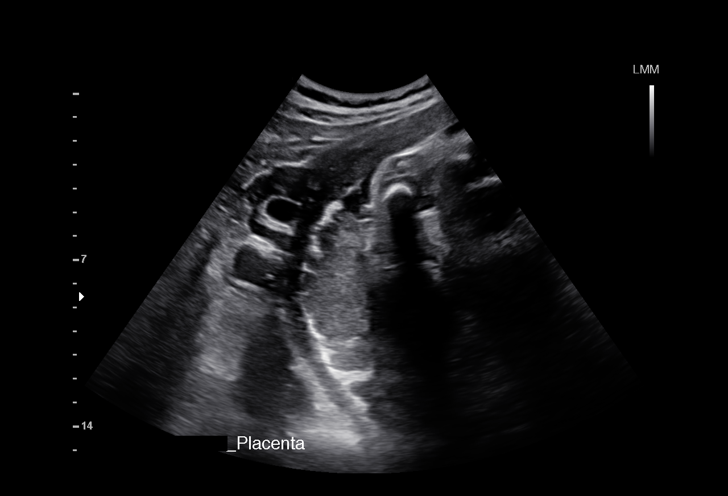
[im 17/23]
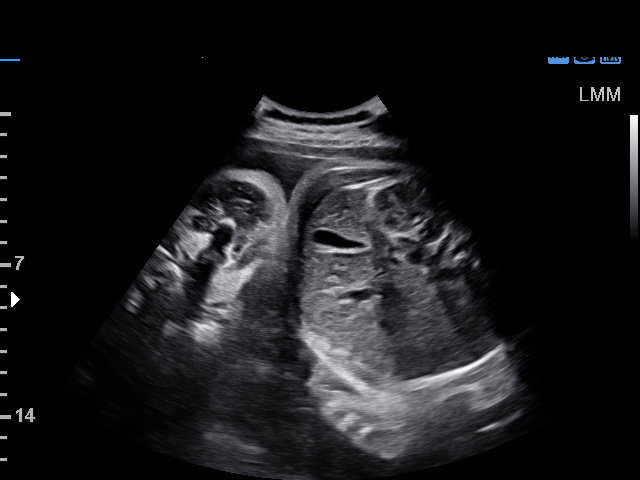
[im 18/23]
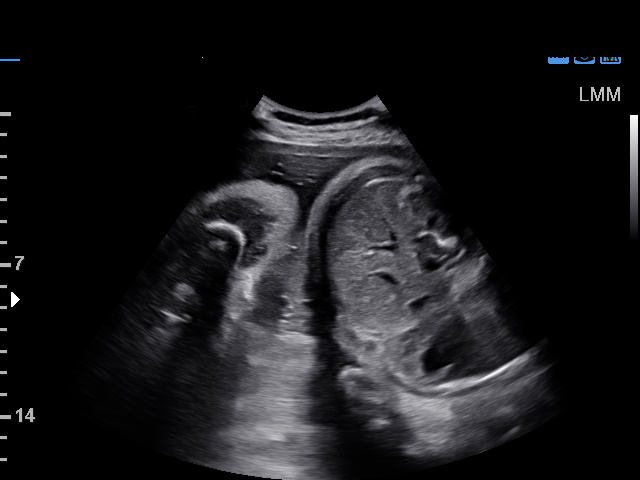
[im 20/23]
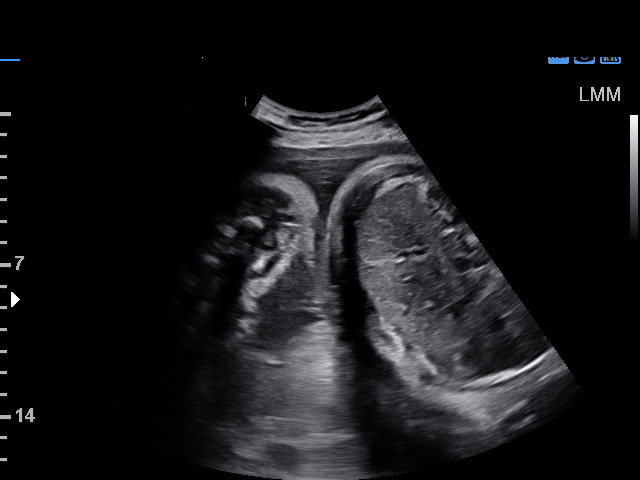
[im 21/23]
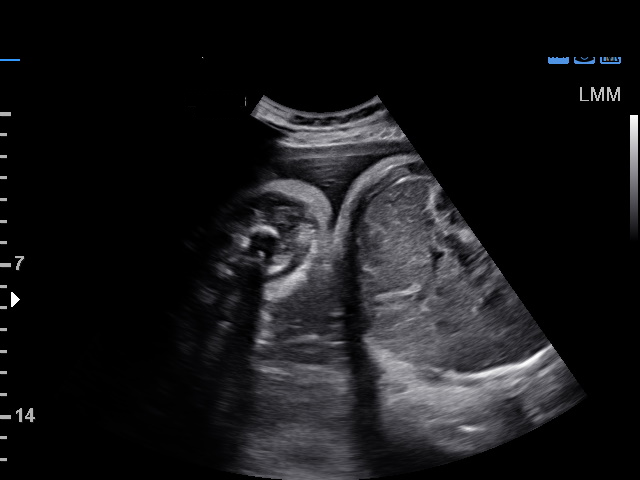
[im 23/23]
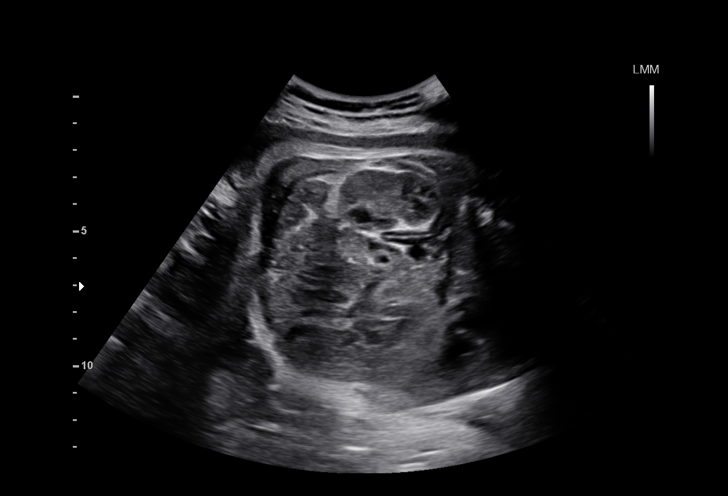

[15 of 23 positions shown; findings below may reference images not displayed]

OBSTETRICS REPORT
(Signed Final 12/23/2014 [DATE])

Service(s) Provided

Indications

Non-reactive NST
39 weeks gestation of pregnancy
Fetal Evaluation

Num Of Fetuses:    1
Fetal Heart Rate:  159                          bpm
Cardiac Activity:  Observed
Presentation:      Cephalic
Placenta:          Posterior, above cervical
os

Amniotic Fluid
AFI FV:      Subjectively within normal limits
AFI Sum:     14.26   cm       57  %Tile     Larg Pckt:    5.96  cm
RUQ:   2.31    cm   RLQ:    2.72   cm    LUQ:   5.96    cm   LLQ:    3.27   cm
Biophysical Evaluation

Amniotic F.V:   Pocket => 2 cm two         F. Tone:        Observed
planes
F. Movement:    Observed                   Score:          [DATE]
F. Breathing:   Not Observed
Gestational Age

LMP:           39w 1d        Date:  03/24/14                 EDD:   12/29/14
Best:          39w 1d     Det. By:  LMP  (03/24/14)          EDD:   12/29/14
Impression

SIUP at 93w9d (remote interpretation of BPP only)
active singleton fetus
AFI is normal
no previa
BPP [DATE]
Recommendations

Management should be predicated by FHT.   I recommend
correlation with fetal heart tracing, noting I would recommend
offering delivery.

questions or concerns.

## 2016-05-24 ENCOUNTER — Emergency Department (HOSPITAL_COMMUNITY): Payer: Medicaid Other

## 2016-05-24 ENCOUNTER — Emergency Department (HOSPITAL_COMMUNITY)
Admission: EM | Admit: 2016-05-24 | Discharge: 2016-05-24 | Disposition: A | Discharge status: Home / Self Care | Payer: Medicaid Other | Attending: Emergency Medicine | Admitting: Emergency Medicine

## 2016-05-24 ENCOUNTER — Encounter (HOSPITAL_COMMUNITY): Payer: Self-pay | Admitting: *Deleted

## 2016-05-24 DIAGNOSIS — M542 Cervicalgia: Secondary | ICD-10-CM | POA: Diagnosis present

## 2016-05-24 DIAGNOSIS — Z79899 Other long term (current) drug therapy: Secondary | ICD-10-CM | POA: Diagnosis not present

## 2016-05-24 DIAGNOSIS — Y999 Unspecified external cause status: Secondary | ICD-10-CM | POA: Diagnosis not present

## 2016-05-24 DIAGNOSIS — R51 Headache: Secondary | ICD-10-CM | POA: Insufficient documentation

## 2016-05-24 DIAGNOSIS — R1031 Right lower quadrant pain: Secondary | ICD-10-CM | POA: Insufficient documentation

## 2016-05-24 DIAGNOSIS — Y92410 Unspecified street and highway as the place of occurrence of the external cause: Secondary | ICD-10-CM | POA: Insufficient documentation

## 2016-05-24 DIAGNOSIS — R519 Headache, unspecified: Secondary | ICD-10-CM

## 2016-05-24 DIAGNOSIS — Y939 Activity, unspecified: Secondary | ICD-10-CM | POA: Diagnosis not present

## 2016-05-24 DIAGNOSIS — F17210 Nicotine dependence, cigarettes, uncomplicated: Secondary | ICD-10-CM | POA: Insufficient documentation
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
## 2016-05-24 NOTE — ED Notes (Signed)
PT DISCHARGED. INSTRUCTIONS GIVEN. AAOX4. PT IN NO APPARENT DISTRESS OR PAIN. THE OPPORTUNITY TO ASK QUESTIONS WAS PROVIDED. 

## 2016-05-31 ENCOUNTER — Emergency Department (HOSPITAL_COMMUNITY)
Admission: EM | Admit: 2016-05-31 | Discharge: 2016-06-01 | Disposition: A | Discharge status: Home / Self Care | Payer: Medicaid Other | Attending: Emergency Medicine | Admitting: Emergency Medicine

## 2016-05-31 ENCOUNTER — Encounter (HOSPITAL_COMMUNITY): Payer: Self-pay | Admitting: Emergency Medicine

## 2016-05-31 DIAGNOSIS — Z5321 Procedure and treatment not carried out due to patient leaving prior to being seen by health care provider: Secondary | ICD-10-CM | POA: Diagnosis not present

## 2016-05-31 DIAGNOSIS — R1084 Generalized abdominal pain: Secondary | ICD-10-CM | POA: Diagnosis not present

## 2016-05-31 DIAGNOSIS — K0889 Other specified disorders of teeth and supporting structures: Secondary | ICD-10-CM | POA: Diagnosis present

## 2016-05-31 LAB — COMPREHENSIVE METABOLIC PANEL
ALBUMIN: 3.7 g/dL (ref 3.5–5.0)
ALT: 16 U/L (ref 14–54)
AST: 19 U/L (ref 15–41)
Alkaline Phosphatase: 68 U/L (ref 38–126)
Anion gap: 7 (ref 5–15)
BUN: 12 mg/dL (ref 6–20)
CO2: 29 mmol/L (ref 22–32)
Calcium: 8.9 mg/dL (ref 8.9–10.3)
Chloride: 103 mmol/L (ref 101–111)
Creatinine, Ser: 0.55 mg/dL (ref 0.44–1.00)
GFR calc Af Amer: >60 mL/min (ref >60)
GFR calc non Af Amer: >60 mL/min (ref >60)
GLUCOSE: 97 mg/dL (ref 65–99)
POTASSIUM: 3.5 mmol/L (ref 3.5–5.1)
SODIUM: 139 mmol/L (ref 135–145)
Total Bilirubin: 0.5 mg/dL (ref 0.3–1.2)
Total Protein: 7.1 g/dL (ref 6.5–8.1)

## 2016-05-31 LAB — CBC
HEMATOCRIT: 35 % — AB (ref 36.0–46.0)
Hemoglobin: 11.6 g/dL — ABNORMAL LOW (ref 12.0–15.0)
MCH: 30.5 pg (ref 26.0–34.0)
MCHC: 33.1 g/dL (ref 30.0–36.0)
MCV: 92.1 fL (ref 78.0–100.0)
Platelets: 397 10*3/uL (ref 150–400)
RBC: 3.8 MIL/uL — ABNORMAL LOW (ref 3.87–5.11)
RDW: 13.9 % (ref 11.5–15.5)
WBC: 14.9 10*3/uL — ABNORMAL HIGH (ref 4.0–10.5)

## 2016-05-31 LAB — I-STAT BETA HCG BLOOD, ED (MC, WL, AP ONLY): I-stat hCG, quantitative: <5 m[IU]/mL (ref <5)

## 2016-05-31 LAB — LIPASE, BLOOD: LIPASE: 14 U/L (ref 11–51)

## 2016-05-31 NOTE — ED Triage Notes (Signed)
Patient is complaining of lower dental pain. Patient states she thinks she has an abscess to the area. States she is unable to find a Education officer, community that accepts medicaid. Patient is complaining of generalized lower abdominal pain that radiates to her back. Reports nausea yesterday. Denies emesis or diarrhea.

## 2016-06-01 ENCOUNTER — Emergency Department (HOSPITAL_COMMUNITY)
Admission: EM | Admit: 2016-06-01 | Discharge: 2016-06-01 | Discharge status: Absent without leave | Payer: No Typology Code available for payment source

## 2016-11-24 ENCOUNTER — Emergency Department (HOSPITAL_COMMUNITY)
Admission: EM | Admit: 2016-11-24 | Discharge: 2016-11-24 | Discharge status: Against Medical Advice | Payer: No Typology Code available for payment source

## 2016-11-24 ENCOUNTER — Encounter (HOSPITAL_COMMUNITY): Payer: Self-pay

## 2016-11-24 NOTE — ED Triage Notes (Signed)
Patient left out of triage before writer completed. Patient called someone on her phone and stated, "I am going to have to wait to get an f------ IV and pain meds." patient was told she could have Zofran for nausea. Patient stated."I want an IV and f----- pain meds." paatient left with a blanket wrapped around her.

## 2016-11-24 NOTE — ED Triage Notes (Signed)
Per EMS- Patient c/o N/V/D since last night.

## 2016-12-25 ENCOUNTER — Emergency Department (HOSPITAL_COMMUNITY)
Admission: EM | Admit: 2016-12-25 | Discharge: 2016-12-26 | Disposition: A | Discharge status: Home / Self Care | Payer: Medicaid Other | Attending: Emergency Medicine | Admitting: Emergency Medicine

## 2016-12-25 ENCOUNTER — Encounter (HOSPITAL_COMMUNITY): Payer: Self-pay | Admitting: Emergency Medicine

## 2016-12-25 ENCOUNTER — Other Ambulatory Visit: Payer: Self-pay

## 2016-12-25 DIAGNOSIS — Y999 Unspecified external cause status: Secondary | ICD-10-CM | POA: Diagnosis not present

## 2016-12-25 DIAGNOSIS — Y939 Activity, unspecified: Secondary | ICD-10-CM | POA: Diagnosis not present

## 2016-12-25 DIAGNOSIS — Z79899 Other long term (current) drug therapy: Secondary | ICD-10-CM | POA: Diagnosis not present

## 2016-12-25 DIAGNOSIS — Y929 Unspecified place or not applicable: Secondary | ICD-10-CM | POA: Diagnosis not present

## 2016-12-25 DIAGNOSIS — F419 Anxiety disorder, unspecified: Secondary | ICD-10-CM | POA: Insufficient documentation

## 2016-12-25 DIAGNOSIS — F17210 Nicotine dependence, cigarettes, uncomplicated: Secondary | ICD-10-CM | POA: Diagnosis not present

## 2016-12-25 DIAGNOSIS — S300XXA Contusion of lower back and pelvis, initial encounter: Secondary | ICD-10-CM | POA: Diagnosis not present

## 2016-12-25 DIAGNOSIS — S3992XA Unspecified injury of lower back, initial encounter: Secondary | ICD-10-CM | POA: Diagnosis present
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
## 2016-12-26 ENCOUNTER — Emergency Department (HOSPITAL_COMMUNITY): Payer: Medicaid Other

## 2016-12-26 MED ORDER — IBUPROFEN 600 MG PO TABS: 600.0000 mg | ORAL_TABLET | Freq: Four times a day (QID) | ORAL | 0 refills | Status: DC | PRN

## 2016-12-26 MED ORDER — LORAZEPAM 0.5 MG PO TABS: 0.5000 mg | ORAL_TABLET | Freq: Four times a day (QID) | ORAL | 0 refills | Status: DC | PRN

## 2016-12-26 MED ORDER — IBUPROFEN 200 MG PO TABS
600.0000 mg | ORAL_TABLET | Freq: Once | ORAL | Status: AC
  Administered 2016-12-26: 600 mg via ORAL
  Filled 2016-12-26: qty 3

## 2016-12-26 MED ORDER — LORAZEPAM 1 MG PO TABS
1.0000 mg | ORAL_TABLET | Freq: Once | ORAL | Status: AC
  Administered 2016-12-26: 1 mg via ORAL
  Filled 2016-12-26: qty 1
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
## 2016-12-26 NOTE — ED Notes (Signed)
Pt unable to give urine specimen at this time for POC preg test.   Pt refuses any blood draw.  Pt stated, "I am not pregnant, I have birth control placed inside me".

## 2016-12-26 NOTE — ED Notes (Signed)
Pt made an unsuccessful attempt to provide urine specimen. 

## 2016-12-26 NOTE — ED Notes (Signed)
Pt refused blood draw for I-Stat beta hCG. RN to change to POC urine preg.

## 2016-12-28 ENCOUNTER — Encounter (HOSPITAL_COMMUNITY): Payer: Self-pay | Admitting: *Deleted

## 2016-12-28 ENCOUNTER — Other Ambulatory Visit: Payer: Self-pay

## 2016-12-28 ENCOUNTER — Emergency Department (HOSPITAL_COMMUNITY)
Admission: EM | Admit: 2016-12-28 | Discharge: 2016-12-28 | Disposition: A | Discharge status: Home / Self Care | Payer: Medicaid Other | Attending: Emergency Medicine | Admitting: Emergency Medicine

## 2016-12-28 DIAGNOSIS — X500XXA Overexertion from strenuous movement or load, initial encounter: Secondary | ICD-10-CM | POA: Diagnosis not present

## 2016-12-28 DIAGNOSIS — T148XXA Other injury of unspecified body region, initial encounter: Secondary | ICD-10-CM

## 2016-12-28 DIAGNOSIS — G8929 Other chronic pain: Secondary | ICD-10-CM | POA: Diagnosis not present

## 2016-12-28 DIAGNOSIS — F17210 Nicotine dependence, cigarettes, uncomplicated: Secondary | ICD-10-CM | POA: Diagnosis not present

## 2016-12-28 DIAGNOSIS — M545 Low back pain: Secondary | ICD-10-CM | POA: Insufficient documentation

## 2016-12-28 MED ORDER — CYCLOBENZAPRINE HCL 10 MG PO TABS: 10.0000 mg | ORAL_TABLET | Freq: Two times a day (BID) | ORAL | 0 refills | Status: DC | PRN

## 2016-12-28 MED ORDER — DICLOFENAC SODIUM 75 MG PO TBEC: 75.0000 mg | DELAYED_RELEASE_TABLET | Freq: Two times a day (BID) | ORAL | 0 refills | Status: DC
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
## 2018-10-24 ENCOUNTER — Other Ambulatory Visit: Payer: Self-pay

## 2018-10-24 ENCOUNTER — Emergency Department (HOSPITAL_COMMUNITY)
Admission: EM | Admit: 2018-10-24 | Discharge: 2018-10-24 | Disposition: A | Discharge status: Home / Self Care | Payer: Medicaid Other | Attending: Emergency Medicine | Admitting: Emergency Medicine

## 2018-10-24 ENCOUNTER — Encounter (HOSPITAL_COMMUNITY): Payer: Self-pay

## 2018-10-24 DIAGNOSIS — R197 Diarrhea, unspecified: Secondary | ICD-10-CM | POA: Insufficient documentation

## 2018-10-24 DIAGNOSIS — Z79899 Other long term (current) drug therapy: Secondary | ICD-10-CM | POA: Insufficient documentation

## 2018-10-24 DIAGNOSIS — R102 Pelvic and perineal pain: Secondary | ICD-10-CM | POA: Insufficient documentation

## 2018-10-24 DIAGNOSIS — F17210 Nicotine dependence, cigarettes, uncomplicated: Secondary | ICD-10-CM | POA: Insufficient documentation

## 2018-10-24 DIAGNOSIS — R112 Nausea with vomiting, unspecified: Secondary | ICD-10-CM | POA: Insufficient documentation

## 2018-10-24 LAB — URINALYSIS, ROUTINE W REFLEX MICROSCOPIC
Bacteria, UA: NONE SEEN
Bilirubin Urine: NEGATIVE
Glucose, UA: NEGATIVE mg/dL
Ketones, ur: 80 mg/dL — AB
Leukocytes,Ua: NEGATIVE
Nitrite: NEGATIVE
Protein, ur: NEGATIVE mg/dL
Specific Gravity, Urine: 1.005 (ref 1.005–1.030)
pH: 6 (ref 5.0–8.0)

## 2018-10-24 LAB — CBC
HCT: 39.4 % (ref 36.0–46.0)
Hemoglobin: 13 g/dL (ref 12.0–15.0)
MCH: 31.1 pg (ref 26.0–34.0)
MCHC: 33 g/dL (ref 30.0–36.0)
MCV: 94.3 fL (ref 80.0–100.0)
Platelets: 261 10*3/uL (ref 150–400)
RBC: 4.18 MIL/uL (ref 3.87–5.11)
RDW: 12.8 % (ref 11.5–15.5)
WBC: 13.4 10*3/uL — ABNORMAL HIGH (ref 4.0–10.5)
nRBC: 0 % (ref 0.0–0.2)

## 2018-10-24 LAB — RAPID URINE DRUG SCREEN, HOSP PERFORMED
Amphetamines: NOT DETECTED
Barbiturates: NOT DETECTED
Benzodiazepines: NOT DETECTED
Cocaine: NOT DETECTED
Opiates: POSITIVE — AB
Tetrahydrocannabinol: POSITIVE — AB

## 2018-10-24 LAB — COMPREHENSIVE METABOLIC PANEL
ALT: 14 U/L (ref 0–44)
AST: 16 U/L (ref 15–41)
Albumin: 4.4 g/dL (ref 3.5–5.0)
Alkaline Phosphatase: 39 U/L (ref 38–126)
Anion gap: 14 (ref 5–15)
BUN: 14 mg/dL (ref 6–20)
CO2: 25 mmol/L (ref 22–32)
Calcium: 9.3 mg/dL (ref 8.9–10.3)
Chloride: 99 mmol/L (ref 98–111)
Creatinine, Ser: 0.6 mg/dL (ref 0.44–1.00)
GFR calc Af Amer: >60 mL/min (ref >60)
GFR calc non Af Amer: >60 mL/min (ref >60)
Glucose, Bld: 111 mg/dL — ABNORMAL HIGH (ref 70–99)
Potassium: 3.1 mmol/L — ABNORMAL LOW (ref 3.5–5.1)
Sodium: 138 mmol/L (ref 135–145)
Total Bilirubin: 0.7 mg/dL (ref 0.3–1.2)
Total Protein: 7.1 g/dL (ref 6.5–8.1)

## 2018-10-24 LAB — I-STAT BETA HCG BLOOD, ED (MC, WL, AP ONLY): I-stat hCG, quantitative: <5 m[IU]/mL (ref <5)

## 2018-10-24 LAB — LIPASE, BLOOD: Lipase: 22 U/L (ref 11–51)

## 2018-10-24 MED ORDER — MORPHINE SULFATE (PF) 4 MG/ML IV SOLN
4.0000 mg | Freq: Once | INTRAVENOUS | Status: AC
  Administered 2018-10-24: 10:00:00 4 mg via INTRAVENOUS
  Filled 2018-10-24: qty 1

## 2018-10-24 MED ORDER — METOCLOPRAMIDE HCL 5 MG/ML IJ SOLN
10.0000 mg | Freq: Once | INTRAMUSCULAR | Status: AC
  Administered 2018-10-24: 08:00:00 10 mg via INTRAVENOUS
  Filled 2018-10-24: qty 2

## 2018-10-24 MED ORDER — POTASSIUM CHLORIDE CRYS ER 20 MEQ PO TBCR
40.0000 meq | EXTENDED_RELEASE_TABLET | Freq: Once | ORAL | Status: AC
  Administered 2018-10-24: 12:00:00 40 meq via ORAL
  Filled 2018-10-24: qty 2

## 2018-10-24 MED ORDER — POTASSIUM CHLORIDE CRYS ER 20 MEQ PO TBCR
40.0000 meq | EXTENDED_RELEASE_TABLET | Freq: Once | ORAL | Status: AC
  Administered 2018-10-24: 12:00:00 40 meq via ORAL

## 2018-10-24 MED ORDER — SODIUM CHLORIDE 0.9% FLUSH
3.0000 mL | Freq: Once | INTRAVENOUS | Status: AC
  Administered 2018-10-24: 08:00:00 3 mL via INTRAVENOUS

## 2018-10-24 MED ORDER — MORPHINE SULFATE (PF) 2 MG/ML IV SOLN
2.0000 mg | Freq: Once | INTRAVENOUS | Status: AC
  Administered 2018-10-24: 09:00:00 2 mg via INTRAVENOUS
  Filled 2018-10-24: qty 1

## 2018-10-24 MED ORDER — LORAZEPAM 2 MG/ML IJ SOLN
0.5000 mg | Freq: Once | INTRAMUSCULAR | Status: AC
  Administered 2018-10-24: 10:00:00 0.5 mg via INTRAVENOUS
  Filled 2018-10-24: qty 1

## 2018-10-24 MED ORDER — KETOROLAC TROMETHAMINE 30 MG/ML IJ SOLN
30.0000 mg | Freq: Once | INTRAMUSCULAR | Status: AC
  Administered 2018-10-24: 10:00:00 30 mg via INTRAVENOUS
  Filled 2018-10-24: qty 1

## 2018-10-24 MED ORDER — SODIUM CHLORIDE 0.9 % IV BOLUS
2000.0000 mL | Freq: Once | INTRAVENOUS | Status: AC
  Administered 2018-10-24: 08:00:00 2000 mL via INTRAVENOUS

## 2018-10-24 MED ORDER — SODIUM CHLORIDE 0.9 % IV SOLN
INTRAVENOUS | Status: DC
  Administered 2018-10-24: 08:00:00 via INTRAVENOUS

## 2018-10-24 NOTE — ED Provider Notes (Signed)
[facility]-EMERGENCY DEPT Provider Note   CSN: [id number] Arrival date & time: 10/24/18  6834     History   Chief Complaint Chief Complaint  Patient presents with  . Abdominal Pain  . Emesis    HPI [name] is a 28 y.o. female.     28 year old female presents with 2 days of emesis as well as diarrhea after eating bad food.  States that her diarrhea has been watery and her emesis is nonbilious or bloody.  Denies any fever or chills.  Denies URI symptoms.  No urinary symptoms.  Has had some abdominal cramping and mild headache associated with emesis.  Denies any persistent pain.  Denies any vaginal bleeding or discharge.  Symptoms have been progressing and nothing makes them better worse no treatment use prior to arrival.     Past Medical History:  Diagnosis Date  . Anxiety   . HPV (human papilloma virus) infection   . Ovarian cyst 2012  . Shingles   . Vaginal Pap smear, abnormal     Patient Active Problem List   Diagnosis Date Noted  . Complication of intrauterine device (IUD) (HCC) 03/13/2015  . Pelvic pain in female 03/13/2015  . Postpartum state 12/23/2014    Past Surgical History:  Procedure Laterality Date  . CERVICAL BIOPSY    . CERVICAL CONIZATION W/BX N/A 05/13/2015   Procedure: CONIZATION CERVIX WITH BIOPSY;  Surgeon: Carrington Clamp, MD;  Location: [facility];  Service: Gynecology;  Laterality: N/A;  . LACERATION REPAIR     neck  . LEEP N/A 05/13/2015   Procedure: LOOP ELECTROSURGICAL EXCISION PROCEDURE (LEEP);  Surgeon: Carrington Clamp, MD;  Location: [facility];  Service: Gynecology;  Laterality: N/A;     OB History    Gravida  3   Para  3   Term  3   Preterm  0   AB  0   Living  3     SAB  0   TAB  0   Ectopic  0   Multiple  0   Live Births  1            Home Medications    Prior to Admission medications   Medication Sig Start Date End Date Taking? Authorizing Provider  clindamycin (CLEOCIN) 300 MG  capsule Take 1 capsule (300 mg total) by mouth 4 (four) times daily. X 7 days 12/17/15   Zadie Rhine, MD  cyclobenzaprine (FLEXERIL) 10 MG tablet Take 1 tablet (10 mg total) by mouth 2 (two) times daily as needed for muscle spasms. 12/28/16   Elson Areas, PA-C  diclofenac (VOLTAREN) 75 MG EC tablet Take 1 tablet (75 mg total) by mouth 2 (two) times daily. 12/28/16   Elson Areas, PA-C  EPINEPHrine (EPIPEN 2-PAK) 0.3 mg/0.3 mL IJ SOAJ injection Inject 0.3 mg into the muscle once as needed (for severe allergic reaction).    [provider]  ibuprofen (ADVIL,MOTRIN) 600 MG tablet Take 1 tablet (600 mg total) every 6 (six) hours as needed by mouth. 12/26/16   Elpidio Anis, PA-C  levonorgestrel (MIRENA) 20 MCG/24HR IUD 1 each by Intrauterine route once.    [provider]  LORazepam (ATIVAN) 0.5 MG tablet Take 1 tablet (0.5 mg total) every 6 (six) hours as needed by mouth for anxiety. 12/26/16   Elpidio Anis, PA-C    Family History Family History  Problem Relation Age of Onset  . Cancer Mother   . Cancer Maternal Aunt  Social History Social History   Tobacco Use  . Smoking status: Current Every Day Smoker    Packs/day: 0.50    Years: 10.00    Pack years: 5.00    Types: Cigarettes  . Smokeless tobacco: Never Used  Substance Use Topics  . Alcohol use: No  . Drug use: No     Allergies   Bee venom and Tramadol   Review of Systems Review of Systems  All other systems reviewed and are negative.    Physical Exam Updated Vital Signs BP (!) 119/56 (BP Location: Left Arm)   Pulse 63   Temp 99 F (37.2 C) (Oral)   Resp 17   SpO2 98%   Physical Exam Vitals signs and nursing note reviewed.  Constitutional:      General: She is not in acute distress.    Appearance: Normal appearance. She is well-developed. She is not toxic-appearing.  HENT:     Head: Normocephalic and atraumatic.  Eyes:     General: Lids are normal.     Conjunctiva/sclera:  Conjunctivae normal.     Pupils: Pupils are equal, round, and reactive to light.  Neck:     Musculoskeletal: Normal range of motion and neck supple.     Thyroid: No thyroid mass.     Trachea: No tracheal deviation.  Cardiovascular:     Rate and Rhythm: Normal rate and regular rhythm.     Heart sounds: Normal heart sounds. No murmur. No gallop.   Pulmonary:     Effort: Pulmonary effort is normal. No respiratory distress.     Breath sounds: Normal breath sounds. No stridor. No decreased breath sounds, wheezing, rhonchi or rales.  Abdominal:     General: Bowel sounds are normal. There is no distension.     Palpations: Abdomen is soft.     Tenderness: There is no abdominal tenderness. There is no guarding or rebound.  Musculoskeletal: Normal range of motion.        General: No tenderness.  Skin:    General: Skin is warm and dry.     Findings: No abrasion or rash.  Neurological:     Mental Status: She is alert and oriented to person, place, and time.     GCS: GCS eye subscore is 4. GCS verbal subscore is 5. GCS motor subscore is 6.     Cranial Nerves: No cranial nerve deficit.     Sensory: No sensory deficit.  Psychiatric:        Speech: Speech normal.        Behavior: Behavior normal.      ED Treatments / Results  Labs (all labs ordered are listed, but only abnormal results are displayed) Labs Reviewed  LIPASE, BLOOD  COMPREHENSIVE METABOLIC PANEL  CBC  URINALYSIS, ROUTINE W REFLEX MICROSCOPIC  I-STAT BETA HCG BLOOD, ED ([facility], [facility], [facility] ONLY)    EKG None  Radiology No results found.  Procedures Procedures (including critical care time)  Medications Ordered in ED Medications  sodium chloride 0.9 % bolus 2,000 mL (has no administration in time range)  0.9 %  sodium chloride infusion (has no administration in time range)  metoCLOPramide (REGLAN) injection 10 mg (has no administration in time range)  morphine 2 MG/ML injection 2 mg (has no administration in time range)   sodium chloride flush (NS) 0.9 % injection 3 mL (3 mLs Intravenous Given 10/24/18 0800)     Initial Impression / Assessment and Plan / ED Course  I have reviewed the  triage vital signs and the nursing notes.  Pertinent labs & imaging results that were available during my care of the patient were reviewed by me and considered in my medical decision making (see chart for details).        Patient evidence of dehydration and treat with IV fluids as well as antiemetics.  UDS was positive for THC.  Mild hypokalemia treated with oral potassium.  Patient feels better will be discharged home  Final Clinical Impressions(s) / ED Diagnoses   Final diagnoses:  None    ED Discharge Orders    None       Lacretia Leigh, MD 10/24/18 1120

## 2018-10-24 NOTE — ED Triage Notes (Signed)
Patient dropped off.   Patient states, "I ate something really bad on Sunday". Patient reports waking up yesterday morning throwing up something brown and black. Patient reports unable to keep anything down.    C/o abdominal pain and states she is thirsty.  C/o N/V and diarrhea  C/O body aches  8/10 pain sharp/achy  Patient ate a protein box that has eggs, bread, cheese, grapes, apples, and peanut butter.    A/oX4 Ambulatory in triage.   Temp-99

## 2018-10-25 ENCOUNTER — Other Ambulatory Visit: Payer: Self-pay

## 2018-10-25 ENCOUNTER — Emergency Department (HOSPITAL_COMMUNITY)
Admission: EM | Admit: 2018-10-25 | Discharge: 2018-10-25 | Disposition: A | Discharge status: Home / Self Care | Payer: Self-pay | Attending: Emergency Medicine | Admitting: Emergency Medicine

## 2018-10-25 DIAGNOSIS — R112 Nausea with vomiting, unspecified: Secondary | ICD-10-CM | POA: Insufficient documentation

## 2018-10-25 DIAGNOSIS — R197 Diarrhea, unspecified: Secondary | ICD-10-CM | POA: Insufficient documentation

## 2018-10-25 DIAGNOSIS — Z5321 Procedure and treatment not carried out due to patient leaving prior to being seen by health care provider: Secondary | ICD-10-CM | POA: Insufficient documentation

## 2018-10-25 NOTE — ED Triage Notes (Signed)
Patient reports to the ED after being seen yesterday. Patient reports she was seen and discharged without nausea or pain medication and wants to be reseen.  Patient reports she was diagnosed with food poisoning and is upset at not having medication at home.

## 2018-10-29 ENCOUNTER — Other Ambulatory Visit: Payer: Self-pay

## 2018-10-29 ENCOUNTER — Encounter (HOSPITAL_COMMUNITY): Payer: Self-pay

## 2018-10-29 ENCOUNTER — Emergency Department (HOSPITAL_COMMUNITY)
Admission: EM | Admit: 2018-10-29 | Discharge: 2018-10-29 | Disposition: A | Discharge status: Home / Self Care | Payer: Medicaid - Out of State | Attending: Emergency Medicine | Admitting: Emergency Medicine

## 2018-10-29 ENCOUNTER — Other Ambulatory Visit: Payer: Self-pay | Admitting: Emergency Medicine

## 2018-10-29 DIAGNOSIS — N939 Abnormal uterine and vaginal bleeding, unspecified: Secondary | ICD-10-CM | POA: Diagnosis present

## 2018-10-29 DIAGNOSIS — Z79899 Other long term (current) drug therapy: Secondary | ICD-10-CM | POA: Diagnosis not present

## 2018-10-29 DIAGNOSIS — F17210 Nicotine dependence, cigarettes, uncomplicated: Secondary | ICD-10-CM | POA: Diagnosis not present

## 2018-10-29 DIAGNOSIS — F12188 Cannabis abuse with other cannabis-induced disorder: Secondary | ICD-10-CM | POA: Diagnosis not present

## 2018-10-29 DIAGNOSIS — R109 Unspecified abdominal pain: Secondary | ICD-10-CM | POA: Diagnosis not present

## 2018-10-29 LAB — WET PREP, GENITAL
Sperm: NONE SEEN
Trich, Wet Prep: NONE SEEN
Yeast Wet Prep HPF POC: NONE SEEN

## 2018-10-29 LAB — CBC WITH DIFFERENTIAL/PLATELET
Abs Immature Granulocytes: 0.02 10*3/uL (ref 0.00–0.07)
Basophils Absolute: 0 10*3/uL (ref 0.0–0.1)
Basophils Relative: 1 %
Eosinophils Absolute: 0.1 10*3/uL (ref 0.0–0.5)
Eosinophils Relative: 2 %
HCT: 35.4 % — ABNORMAL LOW (ref 36.0–46.0)
Hemoglobin: 11.6 g/dL — ABNORMAL LOW (ref 12.0–15.0)
Immature Granulocytes: 0 %
Lymphocytes Relative: 40 %
Lymphs Abs: 2.4 10*3/uL (ref 0.7–4.0)
MCH: 31.5 pg (ref 26.0–34.0)
MCHC: 32.8 g/dL (ref 30.0–36.0)
MCV: 96.2 fL (ref 80.0–100.0)
Monocytes Absolute: 0.4 10*3/uL (ref 0.1–1.0)
Monocytes Relative: 7 %
Neutro Abs: 3.1 10*3/uL (ref 1.7–7.7)
Neutrophils Relative %: 50 %
Platelets: 223 10*3/uL (ref 150–400)
RBC: 3.68 MIL/uL — ABNORMAL LOW (ref 3.87–5.11)
RDW: 12.7 % (ref 11.5–15.5)
WBC: 6.1 10*3/uL (ref 4.0–10.5)
nRBC: 0 % (ref 0.0–0.2)

## 2018-10-29 LAB — URINALYSIS, ROUTINE W REFLEX MICROSCOPIC
Bilirubin Urine: NEGATIVE
Glucose, UA: NEGATIVE mg/dL
Hgb urine dipstick: NEGATIVE
Ketones, ur: NEGATIVE mg/dL
Leukocytes,Ua: NEGATIVE
Nitrite: NEGATIVE
Protein, ur: NEGATIVE mg/dL
Specific Gravity, Urine: 1.003 — ABNORMAL LOW (ref 1.005–1.030)
pH: 8 (ref 5.0–8.0)

## 2018-10-29 LAB — COMPREHENSIVE METABOLIC PANEL
ALT: 13 U/L (ref 0–44)
AST: 13 U/L — ABNORMAL LOW (ref 15–41)
Albumin: 3.8 g/dL (ref 3.5–5.0)
Alkaline Phosphatase: 36 U/L — ABNORMAL LOW (ref 38–126)
Anion gap: 6 (ref 5–15)
BUN: 7 mg/dL (ref 6–20)
CO2: 29 mmol/L (ref 22–32)
Calcium: 8.8 mg/dL — ABNORMAL LOW (ref 8.9–10.3)
Chloride: 105 mmol/L (ref 98–111)
Creatinine, Ser: 0.62 mg/dL (ref 0.44–1.00)
GFR calc Af Amer: >60 mL/min (ref >60)
GFR calc non Af Amer: >60 mL/min (ref >60)
Glucose, Bld: 97 mg/dL (ref 70–99)
Potassium: 3.2 mmol/L — ABNORMAL LOW (ref 3.5–5.1)
Sodium: 140 mmol/L (ref 135–145)
Total Bilirubin: 0.2 mg/dL — ABNORMAL LOW (ref 0.3–1.2)
Total Protein: 6.2 g/dL — ABNORMAL LOW (ref 6.5–8.1)

## 2018-10-29 LAB — I-STAT BETA HCG BLOOD, ED (MC, WL, AP ONLY): I-stat hCG, quantitative: <5 m[IU]/mL (ref <5)

## 2018-10-29 LAB — LIPASE, BLOOD: Lipase: 23 U/L (ref 11–51)

## 2018-10-29 MED ORDER — DICYCLOMINE HCL 20 MG PO TABS: 20.0000 mg | ORAL_TABLET | Freq: Two times a day (BID) | ORAL | 0 refills | Status: DC

## 2018-10-29 MED ORDER — ONDANSETRON HCL 4 MG PO TABS: 4.0000 mg | ORAL_TABLET | Freq: Four times a day (QID) | ORAL | 0 refills | Status: DC | PRN

## 2018-10-29 NOTE — Discharge Instructions (Signed)
Get help right away if: You cannot stop vomiting. You have blood in your vomit or your vomit looks like coffee grounds. You have severe abdominal pain. You have stools that are bloody or black, or stools that look like tar. You have symptoms of dehydration, such as: Sunken eyes. Inability to make tears. Cracked lips. Dry mouth. Decreased urine production. Weakness. Sleepiness. Fainting.

## 2018-10-29 NOTE — ED Notes (Signed)
An After Visit Summary was printed and given to the patient. Discharge instructions given and patient has no further questions at this time.  

## 2018-10-29 NOTE — ED Provider Notes (Signed)
[facility]-EMERGENCY DEPT Provider Note   CSN: [id number] Arrival date & time: 10/29/18  1628     History   Chief Complaint Chief Complaint  Patient presents with   Vaginal Bleeding    HPI [name] is a 28 y.o. female who presents emergency department with chief complaint of nausea, abdominal cramping and vaginal bleeding.  She was seen with complaints of nausea and vomiting along with abdominal pain on 915 and had a negative work-up.  Since that time she has had persistent nausea but has been having very poor oral intake because she is afraid she will vomit.  She continues to have abdominal cramping predominantly in the lower abdominal region.  She also started spotting over the past 2 days and states that this is abnormal for her because she has a Mirena IUD.  She denies any fevers, chills, diarrhea.  Patient does admit to smoking marijuana daily which she states helps with her anxiety and makes her feel better better.  She states the only thing that makes her nausea better is hot showers, hot baths or a heating pad on her abdomen.     HPI  Past Medical History:  Diagnosis Date   Anxiety    HPV (human papilloma virus) infection    Ovarian cyst 2012   Shingles    Vaginal Pap smear, abnormal     Patient Active Problem List   Diagnosis Date Noted   Complication of intrauterine device (IUD) (HCC) 03/13/2015   Pelvic pain in female 03/13/2015   Postpartum state 12/23/2014    Past Surgical History:  Procedure Laterality Date   CERVICAL BIOPSY     CERVICAL CONIZATION W/BX N/A 05/13/2015   Procedure: CONIZATION CERVIX WITH BIOPSY;  Surgeon: Carrington Clamp, MD;  Location: [facility];  Service: Gynecology;  Laterality: N/A;   LACERATION REPAIR     neck   LEEP N/A 05/13/2015   Procedure: LOOP ELECTROSURGICAL EXCISION PROCEDURE (LEEP);  Surgeon: Carrington Clamp, MD;  Location: [facility];  Service: Gynecology;  Laterality: N/A;     OB History    Gravida  3   Para  3   Term  3   Preterm  0   AB  0   Living  3     SAB  0   TAB  0   Ectopic  0   Multiple  0   Live Births  1            Home Medications    Prior to Admission medications   Medication Sig Start Date End Date Taking? Authorizing Provider  bismuth subsalicylate (PEPTO BISMOL) 262 MG/15ML suspension Take 30 mLs by mouth every 6 (six) hours as needed for indigestion or diarrhea or loose stools.   Yes [provider]  levonorgestrel (MIRENA) 20 MCG/24HR IUD 1 each by Intrauterine route once.   Yes [provider]  clindamycin (CLEOCIN) 300 MG capsule Take 1 capsule (300 mg total) by mouth 4 (four) times daily. X 7 days Patient not taking: Reported on 10/24/2018 12/17/15   Zadie Rhine, MD  cyclobenzaprine (FLEXERIL) 10 MG tablet Take 1 tablet (10 mg total) by mouth 2 (two) times daily as needed for muscle spasms. Patient not taking: Reported on 10/24/2018 12/28/16   Elson Areas, PA-C  diclofenac (VOLTAREN) 75 MG EC tablet Take 1 tablet (75 mg total) by mouth 2 (two) times daily. Patient not taking: Reported on 10/24/2018 12/28/16   Elson Areas, PA-C  EPINEPHrine (  EPIPEN 2-PAK) 0.3 mg/0.3 mL IJ SOAJ injection Inject 0.3 mg into the muscle once as needed (for severe allergic reaction).    [provider]  ibuprofen (ADVIL,MOTRIN) 600 MG tablet Take 1 tablet (600 mg total) every 6 (six) hours as needed by mouth. Patient not taking: Reported on 10/24/2018 12/26/16   Charlann Lange, PA-C  LORazepam (ATIVAN) 0.5 MG tablet Take 1 tablet (0.5 mg total) every 6 (six) hours as needed by mouth for anxiety. Patient not taking: Reported on 10/24/2018 12/26/16   Charlann Lange, PA-C    Family History Family History  Problem Relation Age of Onset   Cancer Mother    Cancer Maternal Aunt     Social History Social History   Tobacco Use   Smoking status: Current Every Day Smoker    Packs/day: 0.50    Years: 10.00    Pack  years: 5.00    Types: Cigarettes   Smokeless tobacco: Never Used  Substance Use Topics   Alcohol use: No   Drug use: No     Allergies   Bee venom and Tramadol   Review of Systems Review of Systems  Ten systems reviewed and are negative for acute change, except as noted in the HPI.   Physical Exam Updated Vital Signs BP 126/78 (BP Location: Left Arm)    Pulse 90    Temp 98.6 F (37 C) (Oral)    Resp 16    SpO2 100%   Physical Exam Vitals signs and nursing note reviewed. Exam conducted with a chaperone present.  Constitutional:      General: She is not in acute distress.    Appearance: She is well-developed. She is not diaphoretic.  HENT:     Head: Normocephalic and atraumatic.  Eyes:     General: No scleral icterus.    Conjunctiva/sclera: Conjunctivae normal.  Neck:     Musculoskeletal: Normal range of motion.  Cardiovascular:     Rate and Rhythm: Normal rate and regular rhythm.     Heart sounds: Normal heart sounds. No murmur. No friction rub. No gallop.   Pulmonary:     Effort: Pulmonary effort is normal. No respiratory distress.     Breath sounds: Normal breath sounds.  Abdominal:     General: Bowel sounds are normal. There is no distension.     Palpations: Abdomen is soft. There is no mass.     Tenderness: There is no abdominal tenderness. There is no guarding.  Genitourinary:    Exam position: Lithotomy position.     Vagina: Normal.     Cervix: No discharge.     Uterus: Normal.      Adnexa: Right adnexa normal and left adnexa normal.     Rectum: Normal.     Skin:    General: Skin is warm and dry.  Neurological:     Mental Status: She is alert and oriented to person, place, and time.  Psychiatric:        Behavior: Behavior normal.      ED Treatments / Results  Labs (all labs ordered are listed, but only abnormal results are displayed) Labs Reviewed  CBC WITH DIFFERENTIAL/PLATELET - Abnormal; Notable for the following components:      Result  Value   RBC 3.68 (*)    Hemoglobin 11.6 (*)    HCT 35.4 (*)    All other components within normal limits  WET PREP, GENITAL  COMPREHENSIVE METABOLIC PANEL  LIPASE, BLOOD  URINALYSIS, ROUTINE W REFLEX MICROSCOPIC  RPR  HIV ANTIBODY (ROUTINE TESTING W REFLEX)  I-STAT BETA HCG BLOOD, ED ([facility], [facility], [facility] ONLY)  GC/CHLAMYDIA PROBE AMP ([place]) NOT AT [facility]    EKG None  Radiology No results found.  Procedures Procedures (including critical care time)  Medications Ordered in ED Medications - No data to display   Initial Impression / Assessment and Plan / ED Course  I have reviewed the triage vital signs and the nursing notes.  Pertinent labs & imaging results that were available during my care of the patient were reviewed by me and considered in my medical decision making (see chart for details).        CC: Cramping, vaginal bleeding and persistent nausea VS: BP 110/61 (BP Location: Left Arm)    Pulse (!) 56    Temp 98.6 F (37 C) (Oral)    Resp 18    SpO2 100%   [id number]:History is gathered by patient and EMR. DDX:The emergent differential diagnosis for vomiting includes, but is not limited to ACS/MI, Boerhaave's, DKA, Intracranial Hemorrhage, Ischemic bowel, Meningitis, Sepsis, Acute radiation syndrome, Acute gastric dilation, Acetaminophen toxicity, Adrenal insufficiency, Appendicitis, Aspirin toxicity, Bowel obstruction/ileus, Carbon monoxide poisoning, Cholecystitis, CNS tumor. Digoxin toxicity, Electrolyte abnormalities, Elevated ICP, Gastric outlet obstruction, Hyperemesis gravidarum, Pancreatitis, Peritonitis, Ruptured viscus, Testicular torsion/ovarian torsion, Theophyline toxicity, Biliary colic, Cannabinoid hyperemesis syndrome, Chemotherapy, Disulfiram effect, Erythromycin, ETOH, Gastritis, Gastroenteritis, Gastroparesis, Hepatitis, Ibuprofen, Ipecac toxicity, Labyrinthitis, Migraine, Motion sickness, Narcotic withdrawal, Thyroid, Pregnancy, Peptic ulcer disease, Renal  colic, and UTI Labs: I reviewed the labs which show clue cells on the wet prep however patient is asymptomatic and I do not feel is necessary to treat.  CMP shows mild hypokalemia urine without signs of infection normal lipase, CBC shows mild normocytic anemia Mdm: Patient here with spotting, persistent nausea only relieved with heat packs, hot showers.  She does smoke marijuana daily.  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  I discussed the need to stop smoking marijuana and given a patient handout on this diagnosis.  Patient is also to follow closely with OB/GYN as I am unable to visualize her IUD strings.  Patient will need to use barrier protection if she is to have intercourse which I discussed.  Discussed return precautions.  Patient appears appropriate for discharge at this time patient disposition: dc Patient condition: good. The patient appears reasonably screened and/or stabilized for discharge and I doubt any other medical condition or other [facility] requiring further screening, evaluation, or treatment in the ED at this time prior to discharge. I have discussed lab and/or imaging findings with the patient and answered all questions/concerns to the best of my ability. I have discussed return precautions and OP follow up.      Final Clinical Impressions(s) / ED Diagnoses   Final diagnoses:  None    ED Discharge Orders    None       Arthor CaptainHarris, Lataja Newland, PA-C 10/30/18 1609    Mancel BaleWentz, Elliott, MD 10/31/18 1339

## 2018-10-29 NOTE — ED Triage Notes (Signed)
Pt states that she was seen for same earlier this week. Pt states that she is nauseated and having constant cramps. Pt states that she is also having vaginal bleeding, which is unusual since she has an IUD in place. Pt states that she is having relief with hot showers.

## 2018-10-30 LAB — RPR: RPR Ser Ql: NONREACTIVE

## 2018-10-30 LAB — HIV ANTIBODY (ROUTINE TESTING W REFLEX): HIV Screen 4th Generation wRfx: NONREACTIVE

## 2018-10-31 LAB — CERVICOVAGINAL ANCILLARY ONLY
Chlamydia: NEGATIVE
Neisseria Gonorrhea: NEGATIVE

## 2018-12-06 ENCOUNTER — Encounter: Payer: Medicaid - Out of State | Admitting: Obstetrics and Gynecology

## 2018-12-06 ENCOUNTER — Encounter: Payer: Self-pay | Admitting: Obstetrics and Gynecology

## 2018-12-10 ENCOUNTER — Encounter (HOSPITAL_COMMUNITY): Payer: Self-pay | Admitting: Emergency Medicine

## 2018-12-10 ENCOUNTER — Emergency Department (HOSPITAL_COMMUNITY): Payer: Self-pay

## 2018-12-10 ENCOUNTER — Emergency Department (HOSPITAL_COMMUNITY)
Admission: EM | Admit: 2018-12-10 | Discharge: 2018-12-10 | Disposition: A | Discharge status: Home / Self Care | Payer: Self-pay | Attending: Emergency Medicine | Admitting: Emergency Medicine

## 2018-12-10 ENCOUNTER — Other Ambulatory Visit: Payer: Self-pay

## 2018-12-10 DIAGNOSIS — F17210 Nicotine dependence, cigarettes, uncomplicated: Secondary | ICD-10-CM | POA: Insufficient documentation

## 2018-12-10 DIAGNOSIS — Z79899 Other long term (current) drug therapy: Secondary | ICD-10-CM | POA: Insufficient documentation

## 2018-12-10 DIAGNOSIS — R519 Headache, unspecified: Secondary | ICD-10-CM | POA: Insufficient documentation

## 2018-12-10 DIAGNOSIS — R112 Nausea with vomiting, unspecified: Secondary | ICD-10-CM | POA: Insufficient documentation

## 2018-12-10 MED ORDER — SODIUM CHLORIDE 0.9 % IV BOLUS
1000.0000 mL | Freq: Once | INTRAVENOUS | Status: AC
  Administered 2018-12-10: 1000 mL via INTRAVENOUS

## 2018-12-10 MED ORDER — DEXAMETHASONE SODIUM PHOSPHATE 10 MG/ML IJ SOLN
10.0000 mg | Freq: Once | INTRAMUSCULAR | Status: AC
  Administered 2018-12-10: 10 mg via INTRAVENOUS
  Filled 2018-12-10: qty 1

## 2018-12-10 MED ORDER — KETOROLAC TROMETHAMINE 30 MG/ML IJ SOLN
30.0000 mg | Freq: Once | INTRAMUSCULAR | Status: AC
  Administered 2018-12-10: 30 mg via INTRAVENOUS
  Filled 2018-12-10: qty 1

## 2018-12-10 MED ORDER — DIPHENHYDRAMINE HCL 50 MG/ML IJ SOLN
25.0000 mg | Freq: Once | INTRAMUSCULAR | Status: AC
  Administered 2018-12-10: 25 mg via INTRAVENOUS
  Filled 2018-12-10: qty 1

## 2018-12-10 MED ORDER — PROCHLORPERAZINE EDISYLATE 10 MG/2ML IJ SOLN
10.0000 mg | Freq: Once | INTRAMUSCULAR | Status: AC
  Administered 2018-12-10: 10 mg via INTRAVENOUS
  Filled 2018-12-10: qty 2

## 2018-12-10 NOTE — ED Provider Notes (Signed)
Patient's head CT sent to me by Dr. Melchor Amour.  Reviewed and no acute findings.  Stable for discharge   Lacretia Leigh, MD 12/10/18 [phone]

## 2018-12-10 NOTE — ED Notes (Signed)
Patient transported to CT 

## 2018-12-10 NOTE — ED Provider Notes (Signed)
[facility] DEPT Provider Note   CSN: [id number] Arrival date & time: 12/10/18  6270     History   Chief Complaint Chief Complaint  Patient presents with  . Headache    HPI [name] is a 28 y.o. female.     Patient presents to the emergency department for evaluation of headache.  Patient reports that headache began approximately 8 hours ago while watching a movie.  She reports severe frontal headache associated with light sensitivity, nausea and vomiting.  She has had one similar headache in the past but it did not last this long.  She has never seen a doctor about it and was never told she has migraines or other etiology for the headache.  She has not had a fever.  She denies neck pain and stiffness.     Past Medical History:  Diagnosis Date  . Anxiety   . HPV (human papilloma virus) infection   . Ovarian cyst 2012  . Shingles   . Vaginal Pap smear, abnormal     Patient Active Problem List   Diagnosis Date Noted  . Complication of intrauterine device (IUD) ([facility]) 03/13/2015  . Pelvic pain in female 03/13/2015  . Postpartum state 12/23/2014    Past Surgical History:  Procedure Laterality Date  . CERVICAL BIOPSY    . CERVICAL CONIZATION W/BX N/A 05/13/2015   Procedure: CONIZATION CERVIX WITH BIOPSY;  Surgeon: Bobbye Charleston, MD;  Location: [facility];  Service: Gynecology;  Laterality: N/A;  . LACERATION REPAIR     neck  . LEEP N/A 05/13/2015   Procedure: LOOP ELECTROSURGICAL EXCISION PROCEDURE (LEEP);  Surgeon: Bobbye Charleston, MD;  Location: [facility];  Service: Gynecology;  Laterality: N/A;     OB History    Gravida  3   Para  3   Term  3   Preterm  0   AB  0   Living  3     SAB  0   TAB  0   Ectopic  0   Multiple  0   Live Births  1            Home Medications    Prior to Admission medications   Medication Sig Start Date End Date Taking? Authorizing Provider  bismuth subsalicylate (PEPTO BISMOL) 262  MG/15ML suspension Take 30 mLs by mouth every 6 (six) hours as needed for indigestion or diarrhea or loose stools.    [provider]  clindamycin (CLEOCIN) 300 MG capsule Take 1 capsule (300 mg total) by mouth 4 (four) times daily. X 7 days Patient not taking: Reported on 10/24/2018 12/17/15   Ripley Fraise, MD  cyclobenzaprine (FLEXERIL) 10 MG tablet Take 1 tablet (10 mg total) by mouth 2 (two) times daily as needed for muscle spasms. Patient not taking: Reported on 10/24/2018 12/28/16   Fransico Meadow, PA-C  diclofenac (VOLTAREN) 75 MG EC tablet Take 1 tablet (75 mg total) by mouth 2 (two) times daily. Patient not taking: Reported on 10/24/2018 12/28/16   Fransico Meadow, PA-C  dicyclomine (BENTYL) 20 MG tablet Take 1 tablet (20 mg total) by mouth 2 (two) times daily. 10/29/18   Margarita Mail, PA-C  EPINEPHrine (EPIPEN 2-PAK) 0.3 mg/0.3 mL IJ SOAJ injection Inject 0.3 mg into the muscle once as needed (for severe allergic reaction).    [provider]  ibuprofen (ADVIL,MOTRIN) 600 MG tablet Take 1 tablet (600 mg total) every 6 (six) hours as needed by mouth. Patient not  taking: Reported on 10/24/2018 12/26/16   Elpidio AnisUpstill, Shari, PA-C  levonorgestrel (MIRENA) 20 MCG/24HR IUD 1 each by Intrauterine route once.    [provider]  LORazepam (ATIVAN) 0.5 MG tablet Take 1 tablet (0.5 mg total) every 6 (six) hours as needed by mouth for anxiety. Patient not taking: Reported on 10/24/2018 12/26/16   Elpidio AnisUpstill, Shari, PA-C  ondansetron (ZOFRAN) 4 MG tablet Take 1 tablet (4 mg total) by mouth 4 (four) times daily as needed for nausea or vomiting. 10/29/18   Arthor CaptainHarris, Abigail, PA-C    Family History Family History  Problem Relation Age of Onset  . Cancer Mother   . Cancer Maternal Aunt     Social History Social History   Tobacco Use  . Smoking status: Current Every Day Smoker    Packs/day: 0.50    Years: 10.00    Pack years: 5.00    Types: Cigarettes  . Smokeless tobacco:  Never Used  Substance Use Topics  . Alcohol use: No  . Drug use: No     Allergies   Bee venom and Tramadol   Review of Systems Review of Systems  Constitutional: Negative for fever.  Eyes: Positive for photophobia.  Gastrointestinal: Positive for nausea and vomiting.  Musculoskeletal: Negative for neck pain and neck stiffness.  Neurological: Positive for headaches.  All other systems reviewed and are negative.    Physical Exam Updated Vital Signs BP 117/65 (BP Location: Right Arm)   Pulse 70   Temp 98.5 F (36.9 C) (Oral)   Resp 15   Ht 5\' 1"  (1.549 m)   Wt 52.2 kg   SpO2 99%   BMI 21.73 kg/m   Physical Exam Vitals signs and nursing note reviewed.  Constitutional:      General: She is not in acute distress.    Appearance: Normal appearance. She is well-developed.  HENT:     Head: Normocephalic and atraumatic.     Right Ear: Hearing normal.     Left Ear: Hearing normal.     Nose: Nose normal.  Eyes:     Conjunctiva/sclera: Conjunctivae normal.     Pupils: Pupils are equal, round, and reactive to light.  Neck:     Musculoskeletal: Normal range of motion and neck supple.  Cardiovascular:     Rate and Rhythm: Regular rhythm.     Heart sounds: S1 normal and S2 normal. No murmur. No friction rub. No gallop.   Pulmonary:     Effort: Pulmonary effort is normal. No respiratory distress.     Breath sounds: Normal breath sounds.  Chest:     Chest wall: No tenderness.  Abdominal:     General: Bowel sounds are normal.     Palpations: Abdomen is soft.     Tenderness: There is no abdominal tenderness. There is no guarding or rebound. Negative signs include Murphy's sign and McBurney's sign.     Hernia: No hernia is present.  Musculoskeletal: Normal range of motion.  Skin:    General: Skin is warm and dry.     Findings: No rash.  Neurological:     Mental Status: She is alert and oriented to person, place, and time.     GCS: GCS eye subscore is 4. GCS verbal  subscore is 5. GCS motor subscore is 6.     Cranial Nerves: No cranial nerve deficit.     Sensory: No sensory deficit.     Coordination: Coordination normal.  Psychiatric:  Speech: Speech normal.        Behavior: Behavior normal.        Thought Content: Thought content normal.      ED Treatments / Results  Labs (all labs ordered are listed, but only abnormal results are displayed) Labs Reviewed - No data to display  EKG None  Radiology No results found.  Procedures Procedures (including critical care time)  Medications Ordered in ED Medications  sodium chloride 0.9 % bolus 1,000 mL (1,000 mLs Intravenous New Bag/Given 12/10/18 0643)  ketorolac (TORADOL) 30 MG/ML injection 30 mg (30 mg Intravenous Given 12/10/18 0644)  prochlorperazine (COMPAZINE) injection 10 mg (10 mg Intravenous Given 12/10/18 0644)  diphenhydrAMINE (BENADRYL) injection 25 mg (25 mg Intravenous Given 12/10/18 0643)  dexamethasone (DECADRON) injection 10 mg (10 mg Intravenous Given 12/10/18 0644)     Initial Impression / Assessment and Plan / ED Course  I have reviewed the triage vital signs and the nursing notes.  Pertinent labs & imaging results that were available during my care of the patient were reviewed by me and considered in my medical decision making (see chart for details).        Patient presents to the ER for evaluation of headache.  She had a somewhat sudden onset of headache approximately 8 hours ago while watching TV.  Patient reports severe frontal headache associated with nausea, vomiting, light sensitivity, sound sensitivity.  She has had one similar headache in the past, but this has lasted much longer.  She has never had a diagnosis of migraine or other etiology for her headache.  Because of this will undergo CT scan, but suspect that this is a migraine headache.  She has a normal neurologic exam without any focal neurologic deficits.  Will treat with migraine cocktail.  Will sign  out to oncoming ER physician to follow-up CT scan, if negative, discharge after treatment.  Final Clinical Impressions(s) / ED Diagnoses   Final diagnoses:  Bad headache    ED Discharge Orders    None       Gilda Crease, MD 12/10/18 [phone]

## 2018-12-10 NOTE — Discharge Instructions (Signed)
Schedule follow-up with one of the listed neurology groups to have further evaluation and treatment of your headaches.

## 2018-12-10 NOTE — ED Triage Notes (Signed)
Arrives via EMS. C/C HA x8 hours, was watching a movie when this happened, pain around eyes and nose. Patient is ambulatory. Tried 1000 mg Tylenol with no relief, reports low PO intake, food and fluids.

## 2018-12-12 ENCOUNTER — Other Ambulatory Visit: Payer: Self-pay

## 2018-12-12 ENCOUNTER — Encounter (HOSPITAL_COMMUNITY): Payer: Self-pay | Admitting: Emergency Medicine

## 2018-12-12 DIAGNOSIS — K0889 Other specified disorders of teeth and supporting structures: Secondary | ICD-10-CM | POA: Diagnosis not present

## 2018-12-12 DIAGNOSIS — R519 Headache, unspecified: Secondary | ICD-10-CM | POA: Diagnosis not present

## 2018-12-12 DIAGNOSIS — Z79899 Other long term (current) drug therapy: Secondary | ICD-10-CM | POA: Insufficient documentation

## 2018-12-12 DIAGNOSIS — F17210 Nicotine dependence, cigarettes, uncomplicated: Secondary | ICD-10-CM | POA: Insufficient documentation

## 2018-12-12 NOTE — ED Triage Notes (Signed)
Pt reports having a migraine for the last several days along with anxiety. Pt does report to having nausea.

## 2018-12-13 ENCOUNTER — Encounter: Payer: Self-pay | Admitting: Neurology

## 2018-12-13 ENCOUNTER — Emergency Department (HOSPITAL_COMMUNITY)
Admission: EM | Admit: 2018-12-13 | Discharge: 2018-12-13 | Disposition: A | Discharge status: Home / Self Care | Payer: Medicaid - Out of State | Attending: Emergency Medicine | Admitting: Emergency Medicine

## 2018-12-13 DIAGNOSIS — R519 Headache, unspecified: Secondary | ICD-10-CM

## 2018-12-13 DIAGNOSIS — K0889 Other specified disorders of teeth and supporting structures: Secondary | ICD-10-CM

## 2018-12-13 MED ORDER — KETOROLAC TROMETHAMINE 15 MG/ML IJ SOLN
15.0000 mg | Freq: Once | INTRAMUSCULAR | Status: AC
  Administered 2018-12-13: 02:00:00 15 mg via INTRAMUSCULAR
  Filled 2018-12-13 (×2): qty 1

## 2018-12-13 MED ORDER — CLINDAMYCIN HCL 300 MG PO CAPS: 300.0000 mg | ORAL_CAPSULE | Freq: Three times a day (TID) | ORAL | 0 refills | Status: AC

## 2018-12-13 MED ORDER — DIPHENHYDRAMINE HCL 25 MG PO CAPS
25.0000 mg | ORAL_CAPSULE | Freq: Once | ORAL | Status: AC
  Administered 2018-12-13: 25 mg via ORAL
  Filled 2018-12-13: qty 1

## 2018-12-13 MED ORDER — HYDROCODONE-ACETAMINOPHEN 5-325 MG PO TABS
1.0000 | ORAL_TABLET | Freq: Once | ORAL | Status: AC
  Administered 2018-12-13: 03:00:00 1 via ORAL
  Filled 2018-12-13: qty 1

## 2018-12-13 MED ORDER — PROCHLORPERAZINE MALEATE 10 MG PO TABS
10.0000 mg | ORAL_TABLET | Freq: Once | ORAL | Status: AC
  Administered 2018-12-13: 01:00:00 10 mg via ORAL
  Filled 2018-12-13: qty 1

## 2018-12-13 NOTE — ED Provider Notes (Signed)
[facility]-EMERGENCY DEPT Provider Note   CSN: [id number] Arrival date & time: 12/12/18  2055     History   Chief Complaint Chief Complaint  Patient presents with  . Migraine    HPI [name] is a 28 y.o. female.     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 Patient describes as sharp sensation throughout her whole head, almost like a tension headache.  She reports this headache does not radiate anywhere else.  She does endorse photophobia, nausea, vomiting.  Patient attempted to take remains medication, had 50 mg of sumatriptan at 4 PM today.  Patient was seen in the ED yesterday, was provided with a headache cocktail along with a negative CT of her head.  Patient reports that headache improved after medication received in the ED however after returning home she felt like the headache had returned.  She also endorses right-sided dental pain, does have a broken tooth, states this may have exacerbated her headache.  She reports the fact that she feels unwell makes her anxiety worsen.  She denies any neck rigidity, fevers, history of drug abuse, prior history of cocaine abuse.   The history is provided by the patient and medical records.  Migraine Associated symptoms include headaches. Pertinent negatives include no chest pain and no shortness of breath.    Past Medical History:  Diagnosis Date  . Anxiety   . HPV (human papilloma virus) infection   . Ovarian cyst 2012  . Shingles   . Vaginal Pap smear, abnormal     Patient Active Problem List   Diagnosis Date Noted  . Complication of intrauterine device (IUD) ([facility]) 03/13/2015  . Pelvic pain in female 03/13/2015  . Postpartum state 12/23/2014    Past Surgical History:  Procedure Laterality Date  . CERVICAL BIOPSY    . CERVICAL CONIZATION W/BX N/A 05/13/2015   Procedure: CONIZATION CERVIX WITH BIOPSY;  Surgeon: Carrington ClampMichelle Horvath,  MD;  Location: [facility];  Service: Gynecology;  Laterality: N/A;  . LACERATION REPAIR     neck  . LEEP N/A 05/13/2015   Procedure: LOOP ELECTROSURGICAL EXCISION PROCEDURE (LEEP);  Surgeon: Carrington ClampMichelle Horvath, MD;  Location: [facility];  Service: Gynecology;  Laterality: N/A;     OB History    Gravida  3   Para  3   Term  3   Preterm  0   AB  0   Living  3     SAB  0   TAB  0   Ectopic  0   Multiple  0   Live Births  1            Home Medications    Prior to Admission medications   Medication Sig Start Date End Date Taking? Authorizing Provider  bismuth subsalicylate (PEPTO BISMOL) 262 MG/15ML suspension Take 30 mLs by mouth every 6 (six) hours as needed for indigestion or diarrhea or loose stools.    [provider]  clindamycin (CLEOCIN) 300 MG capsule Take 1 capsule (300 mg total) by mouth 4 (four) times daily. X 7 days Patient not taking: Reported on 10/24/2018 12/17/15   Zadie RhineWickline, Donald, MD  cyclobenzaprine (FLEXERIL) 10 MG tablet Take 1 tablet (10 mg total) by mouth 2 (two) times daily as needed for muscle spasms. Patient not taking: Reported on 10/24/2018 12/28/16   Elson AreasSofia, Leslie K, PA-C  diclofenac (VOLTAREN) 75 MG EC  tablet Take 1 tablet (75 mg total) by mouth 2 (two) times daily. Patient not taking: Reported on 10/24/2018 12/28/16   Elson Areas, PA-C  dicyclomine (BENTYL) 20 MG tablet Take 1 tablet (20 mg total) by mouth 2 (two) times daily. 10/29/18   Arthor Captain, PA-C  EPINEPHrine (EPIPEN 2-PAK) 0.3 mg/0.3 mL IJ SOAJ injection Inject 0.3 mg into the muscle once as needed (for severe allergic reaction).    [provider]  ibuprofen (ADVIL,MOTRIN) 600 MG tablet Take 1 tablet (600 mg total) every 6 (six) hours as needed by mouth. Patient not taking: Reported on 10/24/2018 12/26/16   Elpidio Anis, PA-C  levonorgestrel (MIRENA) 20 MCG/24HR IUD 1 each by Intrauterine route once.    [provider]  LORazepam (ATIVAN) 0.5 MG tablet Take  1 tablet (0.5 mg total) every 6 (six) hours as needed by mouth for anxiety. Patient not taking: Reported on 10/24/2018 12/26/16   Elpidio Anis, PA-C  ondansetron (ZOFRAN) 4 MG tablet Take 1 tablet (4 mg total) by mouth 4 (four) times daily as needed for nausea or vomiting. 10/29/18   Arthor Captain, PA-C    Family History Family History  Problem Relation Age of Onset  . Cancer Mother   . Cancer Maternal Aunt     Social History Social History   Tobacco Use  . Smoking status: Current Every Day Smoker    Packs/day: 0.50    Years: 10.00    Pack years: 5.00    Types: Cigarettes  . Smokeless tobacco: Never Used  Substance Use Topics  . Alcohol use: No  . Drug use: No     Allergies   Bee venom and Tramadol   Review of Systems Review of Systems  Constitutional: Negative for fever.  HENT: Negative for sinus pressure.   Eyes: Positive for photophobia.  Respiratory: Negative for shortness of breath.   Cardiovascular: Negative for chest pain.  Gastrointestinal: Positive for nausea and vomiting.  Genitourinary: Negative for flank pain.  Musculoskeletal: Negative for back pain.  Skin: Negative for pallor and wound.  Neurological: Positive for headaches. Negative for syncope, weakness, light-headedness and numbness.     Physical Exam Updated Vital Signs BP (!) 111/58 (BP Location: Left Arm)   Pulse 65   Temp 98.5 F (36.9 C) (Oral)   Resp 16   Ht 5\' 1"  (1.549 m)   Wt 52.2 kg   SpO2 98%   BMI 21.73 kg/m   Physical Exam Vitals signs and nursing note reviewed.  Constitutional:      General: She is not in acute distress.    Appearance: Normal appearance. She is well-developed.  HENT:     Head: Normocephalic and atraumatic.     Mouth/Throat:     Dentition: Dental caries present.     Pharynx: No oropharyngeal exudate.   Eyes:     Pupils: Pupils are equal, round, and reactive to light.  Neck:     Musculoskeletal: Normal range of motion.  Cardiovascular:     Rate  and Rhythm: Regular rhythm.     Heart sounds: Normal heart sounds.  Pulmonary:     Effort: Pulmonary effort is normal. No respiratory distress.     Breath sounds: Normal breath sounds.  Abdominal:     General: Bowel sounds are normal. There is no distension.     Palpations: Abdomen is soft.     Tenderness: There is no abdominal tenderness.  Musculoskeletal:        General: No tenderness or  deformity.     Right lower leg: No edema.     Left lower leg: No edema.  Skin:    General: Skin is warm and dry.  Neurological:     Mental Status: She is alert and oriented to person, place, and time.     Comments: Alert, oriented, thought content appropriate. Speech fluent without evidence of aphasia. Able to follow 2 step commands without difficulty.  Cranial Nerves:  II:  Peripheral visual fields grossly normal, pupils, round, reactive to light III,IV, VI: ptosis not present, extra-ocular motions intact bilaterally  V,VII: smile symmetric, facial light touch sensation equal VIII: hearing grossly normal bilaterally  IX,X: midline uvula rise  XI: bilateral shoulder shrug equal and strong XII: midline tongue extension  Motor:  5/5 in upper and lower extremities bilaterally including strong and equal grip strength and dorsiflexion/plantar flexion Sensory: light touch normal in all extremities.  Cerebellar: normal finger-to-nose with bilateral upper extremities, pronator drift negative Gait: normal gait and balance       ED Treatments / Results  Labs (all labs ordered are listed, but only abnormal results are displayed) Labs Reviewed - No data to display  EKG None  Radiology No results found.  Procedures Procedures (including critical care time)  Medications Ordered in ED Medications  diphenhydrAMINE (BENADRYL) capsule 25 mg (has no administration in time range)  prochlorperazine (COMPAZINE) tablet 10 mg (has no administration in time range)  ketorolac (TORADOL) 15 MG/ML  injection 15 mg (has no administration in time range)     Initial Impression / Assessment and Plan / ED Course  I have reviewed the triage vital signs and the nursing notes.  Pertinent labs & imaging results that were available during my care of the patient were reviewed by me and considered in my medical decision making (see chart for details).       Patient with a past medical history of back headaches presents to the ED with complaints of a migraine, patient has had this for the past couple days.  She was seen at [facility] long ED yesterday, had a CT which was negative for any acute intracranial hemorrhage or pathology.  Today she reports the headache has returned, she does complain of some right lower tooth pain, does have a missing tooth with some surrounding erythema and likely an abscess.  States this is saturating her symptoms.  She did take sumatriptan 80 mg around 4 PM from her roommate, states the pain did not improve.  Patient has a reassuring neurological exam, CT was within normal limits yesterday, will provide her with a headache cocktail prior to reassessment.  3:02 AM patient was reassessed by me, she reports some improvement to her headache although states that most of the pain is located around the right side of her head, she does have poor dentition throughout, suspect that pain is likely coming from dental region.  We will provide her with Norco to help with her pain.  Patient does report her ride is currently waiting for her outside, will be given this medication and disposition home.  Advised to follow-up with a dentist.   Portions of this note were generated with Dragon dictation software. Dictation errors may occur despite best attempts at proofreading.  Final Clinical Impressions(s) / ED Diagnoses   Final diagnoses:  Bad headache  Pain, dental    ED Discharge Orders    None       Janeece Fitting, PA-C 12/13/18 0308    Cardama, Grayce Sessions, MD  12/13/18 0701   

## 2018-12-13 NOTE — Discharge Instructions (Addendum)
I have provided a short prescription of antibiotics to help with your dental abscess, please take these as directed.  The resources to the dental clinic are attached to your discharge papers, please schedule an appointment for further management of your dental pain.

## 2019-01-16 NOTE — Progress Notes (Deleted)
Virtual Visit via Video Note The purpose of this virtual visit is to provide medical care while limiting exposure to the novel coronavirus.    Consent was obtained for video visit:  Yes.   Answered questions that patient had about telehealth interaction:  Yes.   I discussed the limitations, risks, security and privacy concerns of performing an evaluation and management service by telemedicine. I also discussed with the patient that there may be a patient responsible charge related to this service. The patient expressed understanding and agreed to proceed.  Pt location: Home Physician Location: office Name of referring provider:  Lorre NickAllen, Anthony, MD I connected with [name] at patients initiation/request on 01/19/2019 at  9:10 AM EST by video enabled telemedicine application and verified that I am speaking with the correct person using two identifiers. Pt MRN:  [id number] Pt DOB:  26-Jul-1990 Video Participants:  [name];   History of Present Illness:  [name] is a 28 year old female who presents for headaches.  History supplemented by ED notes.  ***  She had a subsequent headache on 12/10/2018, but it still persisted after 8 hours.  [facility] evaluation.  CT head was personally reviewed and was unremarkable.  She was treated with a headache cocktail and discharged home.  Headache did improve but returned after returning home.  She returned to the ED on 12/13/2018.  She also endorsed right lower tooth pain and was found to have an abscess.  She was treated with a headache cocktail and   Current NSAIDS:  *** Current analgesics:  *** Current triptans:  *** Current ergotamine:  *** Current anti-emetic:  *** Current muscle relaxants:  *** Current anti-anxiolytic:  *** Current sleep aide:  *** Current Antihypertensive medications:  *** Current Antidepressant medications:  *** Current Anticonvulsant medications:  *** Current anti-CGRP:   *** Current Vitamins/Herbal/Supplements:  *** Current Antihistamines/Decongestants:  *** Other therapy:  *** Hormone/birth control:  *** Other medications:  ***  Past NSAIDS:  *** Past analgesics:  *** Past abortive triptans:  *** Past abortive ergotamine:  *** Past muscle relaxants:  *** Past anti-emetic:  *** Past antihypertensive medications:  *** Past antidepressant medications:  *** Past anticonvulsant medications:  *** Past anti-CGRP:  *** Past vitamins/Herbal/Supplements:  *** Past antihistamines/decongestants:  *** Other past therapies:  ***  Caffeine:  *** Alcohol:  *** Smoker:  *** Diet:  *** Exercise:  *** Depression:  ***; Anxiety:  *** Other pain:  *** Sleep hygiene:  *** Family history of headache:  ***   Past Medical History: Past Medical History:  Diagnosis Date  . Anxiety   . HPV (human papilloma virus) infection   . Ovarian cyst 2012  . Shingles   . Vaginal Pap smear, abnormal     Medications: Outpatient Encounter Medications as of 01/19/2019  Medication Sig Note  . bismuth subsalicylate (PEPTO BISMOL) 262 MG/15ML suspension Take 30 mLs by mouth every 6 (six) hours as needed for indigestion or diarrhea or loose stools.   . cyclobenzaprine (FLEXERIL) 10 MG tablet Take 1 tablet (10 mg total) by mouth 2 (two) times daily as needed for muscle spasms. (Patient not taking: Reported on 10/24/2018)   . diclofenac (VOLTAREN) 75 MG EC tablet Take 1 tablet (75 mg total) by mouth 2 (two) times daily. (Patient not taking: Reported on 10/24/2018)   . dicyclomine (BENTYL) 20 MG tablet Take 1 tablet (20 mg total) by mouth 2 (two)  times daily.   Marland Kitchen EPINEPHrine (EPIPEN 2-PAK) 0.3 mg/0.3 mL IJ SOAJ injection Inject 0.3 mg into the muscle once as needed (for severe allergic reaction).   Marland Kitchen ibuprofen (ADVIL,MOTRIN) 600 MG tablet Take 1 tablet (600 mg total) every 6 (six) hours as needed by mouth. (Patient not taking: Reported on 10/24/2018)   . levonorgestrel (MIRENA) 20  MCG/24HR IUD 1 each by Intrauterine route once. 10/24/2018: Has in  . LORazepam (ATIVAN) 0.5 MG tablet Take 1 tablet (0.5 mg total) every 6 (six) hours as needed by mouth for anxiety. (Patient not taking: Reported on 10/24/2018)   . ondansetron (ZOFRAN) 4 MG tablet Take 1 tablet (4 mg total) by mouth 4 (four) times daily as needed for nausea or vomiting.    No facility-administered encounter medications on file as of 01/19/2019.     Allergies: Allergies  Allergen Reactions  . Bee Venom Anaphylaxis  . Tramadol Itching and Nausea And Vomiting    Family History: Family History  Problem Relation Age of Onset  . Cancer Mother   . Cancer Maternal Aunt     Social History: Social History   Socioeconomic History  . Marital status: Single    Spouse name: Not on file  . Number of children: Not on file  . Years of education: Not on file  . Highest education level: Not on file  Occupational History  . Not on file  Social Needs  . Financial resource strain: Not on file  . Food insecurity    Worry: Not on file    Inability: Not on file  . Transportation needs    Medical: Not on file    Non-medical: Not on file  Tobacco Use  . Smoking status: Current Every Day Smoker    Packs/day: 0.50    Years: 10.00    Pack years: 5.00    Types: Cigarettes  . Smokeless tobacco: Never Used  Substance and Sexual Activity  . Alcohol use: No  . Drug use: No  . Sexual activity: Yes    Birth control/protection: I.U.D.    Comment: Mirena IUD inserted 03/2015  Lifestyle  . Physical activity    Days per week: Not on file    Minutes per session: Not on file  . Stress: Not on file  Relationships  . Social Musician on phone: Not on file    Gets together: Not on file    Attends religious service: Not on file    Active member of club or organization: Not on file    Attends meetings of clubs or organizations: Not on file    Relationship status: Not on file  . Intimate partner violence     Fear of current or ex partner: Not on file    Emotionally abused: Not on file    Physically abused: Not on file    Forced sexual activity: Not on file  Other Topics Concern  . Not on file  Social History Narrative  . Not on file    Observations/Objective:   *** No acute distress.  Alert and oriented.  Speech fluent and not dysarthric.  Language intact.  Eyes orthophoric on primary gaze.  Face symmetric.  Assessment and Plan:   ***  Follow Up Instructions:    -I discussed the assessment and treatment plan with the patient. The patient was provided an opportunity to ask questions and all were answered. The patient agreed with the plan and demonstrated an understanding of the instructions.  The patient was advised to call back or seek an in-person evaluation if the symptoms worsen or if the condition fails to improve as anticipated.    Total Time spent in visit with the patient was:  ***, of which more than 50% of the time was spent in counseling and/or coordinating care on ***.   Pt understands and agrees with the plan of care outlined.     Dudley Major, DO

## 2019-01-19 ENCOUNTER — Other Ambulatory Visit: Payer: Self-pay

## 2019-01-19 ENCOUNTER — Telehealth: Payer: Medicaid - Out of State | Admitting: Neurology

## 2020-03-04 ENCOUNTER — Encounter (HOSPITAL_COMMUNITY): Payer: Self-pay

## 2020-03-04 ENCOUNTER — Emergency Department (HOSPITAL_COMMUNITY)
Admission: EM | Admit: 2020-03-04 | Discharge: 2020-03-04 | Disposition: A | Discharge status: Home / Self Care | Payer: Medicaid - Out of State | Attending: Emergency Medicine | Admitting: Emergency Medicine

## 2020-03-04 ENCOUNTER — Other Ambulatory Visit: Payer: Self-pay

## 2020-03-04 DIAGNOSIS — K0889 Other specified disorders of teeth and supporting structures: Secondary | ICD-10-CM | POA: Insufficient documentation

## 2020-03-04 DIAGNOSIS — F17210 Nicotine dependence, cigarettes, uncomplicated: Secondary | ICD-10-CM | POA: Insufficient documentation

## 2020-03-04 MED ORDER — PENICILLIN V POTASSIUM 500 MG PO TABS: 500.0000 mg | ORAL_TABLET | Freq: Four times a day (QID) | ORAL | 0 refills | Status: AC

## 2020-03-04 MED ORDER — NAPROXEN 500 MG PO TABS
500.0000 mg | ORAL_TABLET | Freq: Once | ORAL | Status: AC
  Administered 2020-03-04: 500 mg via ORAL
  Filled 2020-03-04: qty 1

## 2020-03-04 MED ORDER — NAPROXEN 500 MG PO TABS: 500.0000 mg | ORAL_TABLET | Freq: Two times a day (BID) | ORAL | 0 refills | Status: DC

## 2020-03-04 MED ORDER — BENZOCAINE 20 % MT GEL: 1.0000 "application " | Freq: Three times a day (TID) | OROMUCOSAL | 0 refills | Status: DC | PRN

## 2020-03-04 NOTE — ED Notes (Signed)
An After Visit Summary was printed and given to the patient. Discharge instructions given and no further questions at this time.  

## 2020-03-04 NOTE — ED Provider Notes (Signed)
[facility]-EMERGENCY DEPT Provider Note   CSN: [id number] Arrival date & time: 03/04/20  1144     History Chief Complaint  Patient presents with  . Dental Pain    [name] is a 30 y.o. female with a past medical history significant for anxiety who presents to the ED due to worsening left lower dental pain x3 days.  Patient states she has had a cavity for some time which has progressively worsened on her left lower posterior molar.  Denies fever and chills. Pain is worse with mastication.  She has not seen a dentist in numerous years.  Denies associated edema. Denies difficulties swallowing, shortness of breath, and changes to phonation. No treatment prior to arrival.  History obtained from patient and past medical records. No interpreter used during encounter.      Past Medical History:  Diagnosis Date  . Anxiety   . HPV (human papilloma virus) infection   . Ovarian cyst 2012  . Shingles   . Vaginal Pap smear, abnormal     Patient Active Problem List   Diagnosis Date Noted  . Complication of intrauterine device (IUD) (HCC) 03/13/2015  . Pelvic pain in female 03/13/2015  . Postpartum state 12/23/2014    Past Surgical History:  Procedure Laterality Date  . CERVICAL BIOPSY    . CERVICAL CONIZATION W/BX N/A 05/13/2015   Procedure: CONIZATION CERVIX WITH BIOPSY;  Surgeon: Carrington Clamp, MD;  Location: [facility] ORS;  Service: Gynecology;  Laterality: N/A;  . LACERATION REPAIR     neck  . LEEP N/A 05/13/2015   Procedure: LOOP ELECTROSURGICAL EXCISION PROCEDURE (LEEP);  Surgeon: Carrington Clamp, MD;  Location: [facility];  Service: Gynecology;  Laterality: N/A;     OB History    Gravida  3   Para  3   Term  3   Preterm  0   AB  0   Living  3     SAB  0   IAB  0   Ectopic  0   Multiple  0   Live Births  1           Family History  Problem Relation Age of Onset  . Cancer Mother   . Cancer Maternal Aunt     Social History    Tobacco Use  . Smoking status: Current Every Day Smoker    Packs/day: 0.50    Years: 10.00    Pack years: 5.00    Types: Cigarettes  . Smokeless tobacco: Never Used  Vaping Use  . Vaping Use: Never used  Substance Use Topics  . Alcohol use: No  . Drug use: No    Home Medications Prior to Admission medications   Medication Sig Start Date End Date Taking? Authorizing Provider  benzocaine (HURRICAINE) 20 % GEL Use as directed 1 application in the mouth or throat 3 (three) times daily as needed. 03/04/20  Yes Lovett Coffin C, PA-C  naproxen (NAPROSYN) 500 MG tablet Take 1 tablet (500 mg total) by mouth 2 (two) times daily. 03/04/20  Yes Fareeda Downard, Rayfield Citizen C, PA-C  penicillin v potassium (VEETID) 500 MG tablet Take 1 tablet (500 mg total) by mouth 4 (four) times daily for 7 days. 03/04/20 03/11/20 Yes Linzie Criss, Merla Riches, PA-C  bismuth subsalicylate (PEPTO BISMOL) 262 MG/15ML suspension Take 30 mLs by mouth every 6 (six) hours as needed for indigestion or diarrhea or loose stools.    [provider]  cyclobenzaprine (FLEXERIL) 10 MG tablet Take 1  tablet (10 mg total) by mouth 2 (two) times daily as needed for muscle spasms. Patient not taking: Reported on 10/24/2018 12/28/16   Elson Areas, PA-C  diclofenac (VOLTAREN) 75 MG EC tablet Take 1 tablet (75 mg total) by mouth 2 (two) times daily. Patient not taking: Reported on 10/24/2018 12/28/16   Elson Areas, PA-C  dicyclomine (BENTYL) 20 MG tablet Take 1 tablet (20 mg total) by mouth 2 (two) times daily. 10/29/18   Arthor Captain, PA-C  EPINEPHrine (EPIPEN 2-PAK) 0.3 mg/0.3 mL IJ SOAJ injection Inject 0.3 mg into the muscle once as needed (for severe allergic reaction).    [provider]  ibuprofen (ADVIL,MOTRIN) 600 MG tablet Take 1 tablet (600 mg total) every 6 (six) hours as needed by mouth. Patient not taking: Reported on 10/24/2018 12/26/16   Elpidio Anis, PA-C  levonorgestrel (MIRENA) 20 MCG/24HR IUD 1 each by  Intrauterine route once.    [provider]  LORazepam (ATIVAN) 0.5 MG tablet Take 1 tablet (0.5 mg total) every 6 (six) hours as needed by mouth for anxiety. Patient not taking: Reported on 10/24/2018 12/26/16   Elpidio Anis, PA-C  ondansetron (ZOFRAN) 4 MG tablet Take 1 tablet (4 mg total) by mouth 4 (four) times daily as needed for nausea or vomiting. 10/29/18   Arthor Captain, PA-C    Allergies    Bee venom and Tramadol  Review of Systems   Review of Systems  Constitutional: Negative for chills and fever.  HENT: Positive for dental problem. Negative for trouble swallowing and voice change.   All other systems reviewed and are negative.   Physical Exam Updated Vital Signs BP 115/88   Pulse 70   Temp 98.6 F (37 C)   Resp 18   SpO2 99%   Physical Exam Vitals and nursing note reviewed.  Constitutional:      General: She is not in acute distress.    Appearance: She is not ill-appearing.  HENT:     Head: Normocephalic.     Mouth/Throat:     Comments: Numerous dental caries (large ones bilateral lower posterior molars).  No obvious abscess.  Tongue in normal position with no protrusion.  No tenderness below tongue.  Normal phonation.  Tolerating oral secretions without difficulty.  No overlying cheek edema. Eyes:     Pupils: Pupils are equal, round, and reactive to light.  Neck:     Comments: No meningismus. Cardiovascular:     Rate and Rhythm: Normal rate and regular rhythm.     Pulses: Normal pulses.     Heart sounds: Normal heart sounds. No murmur heard. No friction rub. No gallop.   Pulmonary:     Effort: Pulmonary effort is normal.     Breath sounds: Normal breath sounds.  Abdominal:     General: Abdomen is flat. Bowel sounds are normal. There is no distension.     Palpations: Abdomen is soft.     Tenderness: There is no abdominal tenderness. There is no guarding or rebound.  Musculoskeletal:        General: Normal range of motion.     Cervical back:  Neck supple.  Skin:    General: Skin is warm and dry.  Neurological:     General: No focal deficit present.     Mental Status: She is alert.  Psychiatric:        Mood and Affect: Mood normal.        Behavior: Behavior normal.     ED  Results / Procedures / Treatments   Labs (all labs ordered are listed, but only abnormal results are displayed) Labs Reviewed - No data to display  EKG None  Radiology No results found.  Procedures Procedures   Medications Ordered in ED Medications  naproxen (NAPROSYN) tablet 500 mg (has no administration in time range)    ED Course  I have reviewed the triage vital signs and the nursing notes.  Pertinent labs & imaging results that were available during my care of the patient were reviewed by me and considered in my medical decision making (see chart for details).    MDM Rules/Calculators/A&P                         30 year old female presents to the ED due to left lower dental pain x3 days.  No fever or chills.  Upon arrival, vitals all within normal limits.  Patient is afebrile, not tachycardic or hypoxic.  No concern for sepsis at this time.  Physical exam significant for numerous dental caries worst on bilateral lower posterior molars.  No surrounding gingivitis.  Tongue in normal position without protrusion.  No overlying cheek edema.  No abscess appreciated on exam.  Low suspicion for Ludwigs or deep space infection. Suspect pain related to exposed nerve. Naproxen given here in the ED. Discharged patient with naproxen, Pen VK, and Orajel. Dental resources given. Strict ED precautions discussed with patient. Patient states understanding and agrees to plan. Patient discharged home in no acute distress and stable vitals.  Final Clinical Impression(s) / ED Diagnoses Final diagnoses:  Pain, dental    Rx / DC Orders ED Discharge Orders         Ordered    naproxen (NAPROSYN) 500 MG tablet  2 times daily        03/04/20 1609    penicillin  v potassium (VEETID) 500 MG tablet  4 times daily        03/04/20 1609    benzocaine (HURRICAINE) 20 % GEL  3 times daily PRN        03/04/20 1609           Jesusita Oka 03/04/20 1610    Gerhard Munch, MD 03/04/20 [phone]

## 2020-03-04 NOTE — Discharge Instructions (Addendum)
As discussed, I am sending you home with pain medication, antibiotic, and numbing gel.  Use as needed.  Take antibiotic as prescribed and finish all antibiotics.  I have included dental resources in the community.  Please call tomorrow to schedule an appointment for further evaluation.  Return to the ER for new or worsening symptoms.

## 2020-03-04 NOTE — ED Triage Notes (Signed)
Pt arrived via walk in, c/o left sided dental pain, states she has a "hole in my molar" that has been painful. Pain worsening x3 days.   ibuprofen at 830 this morning.

## 2021-02-08 NOTE — L&D Delivery Note (Signed)
Delivery Note At 12:14 AM a viable female was delivered via Vaginal, Spontaneous (Presentation: Left Occiput Anterior).  APGAR: 7, 8; weight  .   Placenta status: Spontaneous, Intact.  Cord: 3 vessels with the following complications: None.  Cord pH: na  Anesthesia: Epidural Episiotomy: None Lacerations: None Suture Repair:  none Est. Blood Loss (mL): 300  Mom to postpartum.  Baby to Couplet care / Skin to Skin.  Tarvares Lant A Sadiq Mccauley 01/20/2022, 1:27 AM

## 2021-09-02 LAB — OB RESULTS CONSOLE RPR: RPR: NONREACTIVE

## 2021-09-02 LAB — OB RESULTS CONSOLE GC/CHLAMYDIA
Chlamydia: NEGATIVE
Neisseria Gonorrhea: NEGATIVE

## 2021-09-02 LAB — OB RESULTS CONSOLE RUBELLA ANTIBODY, IGM: Rubella: IMMUNE

## 2021-09-02 LAB — OB RESULTS CONSOLE HEPATITIS B SURFACE ANTIGEN: Hepatitis B Surface Ag: NEGATIVE

## 2021-11-02 LAB — OB RESULTS CONSOLE HIV ANTIBODY (ROUTINE TESTING)
HIV: NONREACTIVE
HIV: NONREACTIVE

## 2021-11-02 LAB — OB RESULTS CONSOLE RPR: RPR: NONREACTIVE

## 2021-12-10 LAB — OB RESULTS CONSOLE GBS: GBS: NEGATIVE

## 2022-01-06 ENCOUNTER — Inpatient Hospital Stay (HOSPITAL_COMMUNITY)
Admission: AD | Admit: 2022-01-06 | Discharge: 2022-01-06 | Disposition: A | Discharge status: Home / Self Care | Payer: Medicaid Other | Attending: Obstetrics and Gynecology | Admitting: Obstetrics and Gynecology

## 2022-01-06 ENCOUNTER — Encounter (HOSPITAL_COMMUNITY): Payer: Self-pay | Admitting: Obstetrics and Gynecology

## 2022-01-06 DIAGNOSIS — O479 False labor, unspecified: Secondary | ICD-10-CM | POA: Diagnosis not present

## 2022-01-06 DIAGNOSIS — O471 False labor at or after 37 completed weeks of gestation: Secondary | ICD-10-CM | POA: Diagnosis present

## 2022-01-06 DIAGNOSIS — Z1152 Encounter for screening for COVID-19: Secondary | ICD-10-CM | POA: Diagnosis not present

## 2022-01-06 DIAGNOSIS — Z3A38 38 weeks gestation of pregnancy: Secondary | ICD-10-CM | POA: Diagnosis not present

## 2022-01-06 DIAGNOSIS — O26893 Other specified pregnancy related conditions, third trimester: Secondary | ICD-10-CM

## 2022-01-06 DIAGNOSIS — Z3A39 39 weeks gestation of pregnancy: Secondary | ICD-10-CM | POA: Diagnosis present

## 2022-01-06 DIAGNOSIS — R195 Other fecal abnormalities: Secondary | ICD-10-CM

## 2022-01-06 DIAGNOSIS — Z91148 Patient's other noncompliance with medication regimen for other reason: Secondary | ICD-10-CM | POA: Insufficient documentation

## 2022-01-06 DIAGNOSIS — R519 Headache, unspecified: Secondary | ICD-10-CM

## 2022-01-06 DIAGNOSIS — O99891 Other specified diseases and conditions complicating pregnancy: Secondary | ICD-10-CM | POA: Diagnosis not present

## 2022-01-06 DIAGNOSIS — O98513 Other viral diseases complicating pregnancy, third trimester: Secondary | ICD-10-CM | POA: Diagnosis present

## 2022-01-06 DIAGNOSIS — O99343 Other mental disorders complicating pregnancy, third trimester: Secondary | ICD-10-CM | POA: Diagnosis not present

## 2022-01-06 LAB — URINALYSIS, ROUTINE W REFLEX MICROSCOPIC
Bilirubin Urine: NEGATIVE
Glucose, UA: NEGATIVE mg/dL
Hgb urine dipstick: NEGATIVE
Ketones, ur: 20 mg/dL — AB
Leukocytes,Ua: NEGATIVE
Nitrite: NEGATIVE
Protein, ur: NEGATIVE mg/dL
Specific Gravity, Urine: 1.011 (ref 1.005–1.030)
pH: 6 (ref 5.0–8.0)

## 2022-01-06 LAB — RESP PANEL BY RT-PCR (FLU A&B, COVID) ARPGX2
Influenza A by PCR: NEGATIVE
Influenza B by PCR: NEGATIVE
SARS Coronavirus 2 by RT PCR: NEGATIVE

## 2022-01-06 MED ORDER — LOPERAMIDE HCL 2 MG PO CAPS
4.0000 mg | ORAL_CAPSULE | Freq: Once | ORAL | Status: AC
  Administered 2022-01-06: 4 mg via ORAL
  Filled 2022-01-06: qty 2

## 2022-01-06 MED ORDER — CYCLOBENZAPRINE HCL 5 MG PO TABS
10.0000 mg | ORAL_TABLET | Freq: Once | ORAL | Status: AC
  Administered 2022-01-06: 10 mg via ORAL
  Filled 2022-01-06: qty 2

## 2022-01-06 MED ORDER — CYCLOBENZAPRINE HCL 5 MG PO TABS: 5.0000 mg | ORAL_TABLET | Freq: Three times a day (TID) | ORAL | 0 refills | Status: DC | PRN

## 2022-01-06 NOTE — MAU Note (Addendum)
.  [name] is a 31 y.o. at [redacted]w[redacted]d here in MAU reporting: contractions since this am. Denies bleeding or ROM. Positive fetal movement. Pt also reports congestion, cough, sore throat. Pt also reports a headache since yesterday, unrelieved by tylenol LMP:  Onset of complaint: this am Pain score: 7/10 Vitals:   01/06/22 1840  Pulse: 82  Resp: 17  Temp: 98.1 F (36.7 C)  SpO2: 97%     FHT:171 Lab orders placed from triage:

## 2022-01-06 NOTE — MAU Provider Note (Addendum)
History     CSN: [id number]  Arrival date and time: 01/06/22 1825   Event Date/Time   First Provider Initiated Contact with Patient 01/06/22 1858      Chief Complaint  Patient presents with   Labor Eval   HPI [name] is a 31 y.o. [id number] at [redacted]w[redacted]d who presents to MAU with chief complaint of contractions. However, during initial CNM assessment she voiced concern for severe dehydration 2/2 one week of recurrent diarrhea. She states her stools are consistent green-tinged and loose. She has not taken medication for this complaint. She is not experiencing SOB, chest pain, cough, or fever. She denies sick contacts.  Patient also c/o persistent anterior headache 2/2 sinus congestion. She has attempted management with Tylenol but has not experienced relief. She denies visual disturbances. Her pregnancy is not complicated by HTN.  Patient's contractions are uncomfortable, pain score 7/10. She denies vaginal bleeding, LOF, DFM.  Patient states she has never been diagnosed with COVID. She has not received COVID or Flu vaccines.  Patient receives care with [facility].  OB History     Gravida  4   Para  3   Term  3   Preterm  0   AB  0   Living  3      SAB  0   IAB  0   Ectopic  0   Multiple  0   Live Births  1           Past Medical History:  Diagnosis Date   Anxiety    HPV (human papilloma virus) infection    Ovarian cyst 2012   Shingles    Vaginal Pap smear, abnormal     Past Surgical History:  Procedure Laterality Date   CERVICAL BIOPSY     CERVICAL CONIZATION W/BX N/A 05/13/2015   Procedure: CONIZATION CERVIX WITH BIOPSY;  Surgeon: Carrington Clamp, MD;  Location: [facility];  Service: Gynecology;  Laterality: N/A;   LACERATION REPAIR     neck   LEEP N/A 05/13/2015   Procedure: LOOP ELECTROSURGICAL EXCISION PROCEDURE (LEEP);  Surgeon: Carrington Clamp, MD;  Location: [facility];  Service: Gynecology;  Laterality: N/A;    Family History  Problem Relation Age  of Onset   Cancer Mother    Cancer Maternal Aunt     Social History   Tobacco Use   Smoking status: Former    Packs/day: 0.50    Years: 10.00    Total pack years: 5.00    Types: Cigarettes   Smokeless tobacco: Never  Vaping Use   Vaping Use: Never used  Substance Use Topics   Alcohol use: No   Drug use: No    Allergies:  Allergies  Allergen Reactions   Bee Venom Anaphylaxis   Tramadol Itching and Nausea And Vomiting    Medications Prior to Admission  Medication Sig Dispense Refill Last Dose   acetaminophen (TYLENOL) 500 MG tablet Take 1,000 mg by mouth every 6 (six) hours as needed.   01/06/2022 at 1030   Prenatal Vit-Fe Fumarate-FA (MULTIVITAMIN-PRENATAL) 27-0.8 MG TABS tablet Take 1 tablet by mouth daily at 12 noon.   01/06/2022   benzocaine (HURRICAINE) 20 % GEL Use as directed 1 application in the mouth or throat 3 (three) times daily as needed. 30 g 0    bismuth subsalicylate (PEPTO BISMOL) 262 MG/15ML suspension Take 30 mLs by mouth every 6 (six) hours as needed for indigestion or diarrhea or loose stools.  cyclobenzaprine (FLEXERIL) 10 MG tablet Take 1 tablet (10 mg total) by mouth 2 (two) times daily as needed for muscle spasms. (Patient not taking: Reported on 10/24/2018) 20 tablet 0    diclofenac (VOLTAREN) 75 MG EC tablet Take 1 tablet (75 mg total) by mouth 2 (two) times daily. (Patient not taking: Reported on 10/24/2018) 20 tablet 0    dicyclomine (BENTYL) 20 MG tablet Take 1 tablet (20 mg total) by mouth 2 (two) times daily. 20 tablet 0    EPINEPHrine (EPIPEN 2-PAK) 0.3 mg/0.3 mL IJ SOAJ injection Inject 0.3 mg into the muscle once as needed (for severe allergic reaction).      ibuprofen (ADVIL,MOTRIN) 600 MG tablet Take 1 tablet (600 mg total) every 6 (six) hours as needed by mouth. (Patient not taking: Reported on 10/24/2018) 30 tablet 0    levonorgestrel (MIRENA) 20 MCG/24HR IUD 1 each by Intrauterine route once.      LORazepam (ATIVAN) 0.5 MG tablet Take 1  tablet (0.5 mg total) every 6 (six) hours as needed by mouth for anxiety. (Patient not taking: Reported on 10/24/2018) 4 tablet 0    naproxen (NAPROSYN) 500 MG tablet Take 1 tablet (500 mg total) by mouth 2 (two) times daily. 30 tablet 0    ondansetron (ZOFRAN) 4 MG tablet Take 1 tablet (4 mg total) by mouth 4 (four) times daily as needed for nausea or vomiting. 10 tablet 0     Review of Systems  Constitutional:  Positive for fatigue.  HENT:  Positive for congestion.   Gastrointestinal:  Positive for abdominal pain.  Neurological:  Positive for headaches.  All other systems reviewed and are negative.  Physical Exam   Blood pressure (!) 110/58, pulse 82, temperature 98.1 F (36.7 C), temperature source Oral, resp. rate 17, height 5\' 1"  (1.549 m), weight 70.3 kg, SpO2 97 %.  Physical Exam Vitals and nursing note reviewed. Exam conducted with a chaperone present.  Constitutional:      Appearance: Normal appearance. She is not ill-appearing.  Cardiovascular:     Rate and Rhythm: Normal rate and regular rhythm.     Pulses: Normal pulses.     Heart sounds: Normal heart sounds.  Pulmonary:     Effort: Pulmonary effort is normal.     Breath sounds: Normal breath sounds.  Abdominal:     Comments: Gravid  Skin:    Capillary Refill: Capillary refill takes less than 2 seconds.  Neurological:     Mental Status: She is alert and oriented to person, place, and time.  Psychiatric:        Mood and Affect: Mood normal.        Behavior: Behavior normal.        Thought Content: Thought content normal.        Judgment: Judgment normal.     MAU Course  Procedures  MDM Orders Placed This Encounter  Procedures   Resp Panel by RT-PCR (Flu A&B, Covid) Anterior Nasal Swab   Urinalysis, Routine w reflex microscopic Urine, Clean Catch   Contact Isolation: Enteric   Patient Vitals for the past 24 hrs:  BP Temp Temp src Pulse Resp SpO2 Height Weight  01/06/22 1840 (!) 110/58 98.1 F (36.7 C)  Oral 82 17 97 % 5\' 1"  (1.549 m) 70.3 kg   Meds ordered this encounter  Medications   cyclobenzaprine (FLEXERIL) tablet 10 mg   loperamide (IMODIUM) capsule 4 mg    Report given to 01/08/22, CNM who assumes care of  patient at this time.  Clayton Bibles, MSA, MSN, CNM Certified Nurse Midwife, [facility] 01/06/22 8:06 PM  Results for orders placed or performed during the hospital encounter of 01/06/22 (from the past 24 hour(s))  Urinalysis, Routine w reflex microscopic Urine, Clean Catch     Status: Abnormal   Collection Time: 01/06/22  7:07 PM  Result Value Ref Range   Color, Urine YELLOW YELLOW   APPearance CLEAR CLEAR   Specific Gravity, Urine 1.011 1.005 - 1.030   pH 6.0 5.0 - 8.0   Glucose, UA NEGATIVE NEGATIVE mg/dL   Hgb urine dipstick NEGATIVE NEGATIVE   Bilirubin Urine NEGATIVE NEGATIVE   Ketones, ur 20 (A) NEGATIVE mg/dL   Protein, ur NEGATIVE NEGATIVE mg/dL   Nitrite NEGATIVE NEGATIVE   Leukocytes,Ua NEGATIVE NEGATIVE  Resp Panel by RT-PCR (Flu A&B, Covid) Anterior Nasal Swab     Status: None   Collection Time: 01/06/22  7:15 PM   Specimen: Anterior Nasal Swab  Result Value Ref Range   SARS Coronavirus 2 by RT PCR NEGATIVE NEGATIVE   Influenza A by PCR NEGATIVE NEGATIVE   Influenza B by PCR NEGATIVE NEGATIVE   Headache relieved by Flexeril  Loose stools improved with Immodium FHR reactive Dilation: 1 Effacement (%): 50 Cervical Position: Posterior Station: Ballotable Exam by:: weston,rn  Assessment and Plan  A:  SIngle IUP at [redacted]w[redacted]d       Uterine contractions, prodromal vs latent labor       Headache relieved by Flexeril       Loose stools, likely related to prostaglandins  P:  Discharge home       Has appt tomorrow in office       Labor precautions      Encouraged to return if she develops worsening of symptoms, increase in pain, fever, or other concerning symptoms.     Aviva Signs, CNM

## 2022-01-08 ENCOUNTER — Other Ambulatory Visit: Payer: Self-pay | Admitting: Obstetrics & Gynecology

## 2022-01-14 ENCOUNTER — Telehealth (HOSPITAL_COMMUNITY): Payer: Self-pay | Admitting: *Deleted

## 2022-01-14 NOTE — Telephone Encounter (Signed)
Preadmission screen  

## 2022-01-18 ENCOUNTER — Telehealth (HOSPITAL_COMMUNITY): Payer: Self-pay | Admitting: *Deleted

## 2022-01-18 ENCOUNTER — Encounter (HOSPITAL_COMMUNITY): Payer: Self-pay | Admitting: *Deleted

## 2022-01-18 NOTE — Telephone Encounter (Signed)
Preadmission screenPreadmission screen 

## 2022-01-19 ENCOUNTER — Other Ambulatory Visit: Payer: Self-pay

## 2022-01-19 ENCOUNTER — Inpatient Hospital Stay (HOSPITAL_COMMUNITY): Payer: Medicaid Other | Admitting: Anesthesiology

## 2022-01-19 ENCOUNTER — Encounter (HOSPITAL_COMMUNITY): Payer: Self-pay | Admitting: Obstetrics and Gynecology

## 2022-01-19 ENCOUNTER — Inpatient Hospital Stay (HOSPITAL_COMMUNITY)
Admission: AD | Admit: 2022-01-19 | Discharge: 2022-01-21 | DRG: 807 | Disposition: A | Discharge status: Home / Self Care | Payer: Medicaid Other | Attending: Obstetrics and Gynecology | Admitting: Obstetrics and Gynecology

## 2022-01-19 DIAGNOSIS — O99334 Smoking (tobacco) complicating childbirth: Secondary | ICD-10-CM | POA: Diagnosis present

## 2022-01-19 DIAGNOSIS — Z23 Encounter for immunization: Secondary | ICD-10-CM | POA: Diagnosis not present

## 2022-01-19 DIAGNOSIS — O480 Post-term pregnancy: Secondary | ICD-10-CM | POA: Diagnosis present

## 2022-01-19 DIAGNOSIS — F17210 Nicotine dependence, cigarettes, uncomplicated: Secondary | ICD-10-CM | POA: Diagnosis present

## 2022-01-19 DIAGNOSIS — O26893 Other specified pregnancy related conditions, third trimester: Secondary | ICD-10-CM | POA: Diagnosis present

## 2022-01-19 DIAGNOSIS — Z3A40 40 weeks gestation of pregnancy: Secondary | ICD-10-CM

## 2022-01-19 DIAGNOSIS — O479 False labor, unspecified: Principal | ICD-10-CM | POA: Diagnosis present

## 2022-01-19 DIAGNOSIS — Z8659 Personal history of other mental and behavioral disorders: Secondary | ICD-10-CM

## 2022-01-19 DIAGNOSIS — Z3490 Encounter for supervision of normal pregnancy, unspecified, unspecified trimester: Secondary | ICD-10-CM | POA: Diagnosis present

## 2022-01-19 LAB — CBC
HCT: 35.6 % — ABNORMAL LOW (ref 36.0–46.0)
Hemoglobin: 12.3 g/dL (ref 12.0–15.0)
MCH: 31.9 pg (ref 26.0–34.0)
MCHC: 34.6 g/dL (ref 30.0–36.0)
MCV: 92.5 fL (ref 80.0–100.0)
Platelets: 357 10*3/uL (ref 150–400)
RBC: 3.85 MIL/uL — ABNORMAL LOW (ref 3.87–5.11)
RDW: 12.9 % (ref 11.5–15.5)
WBC: 13.4 10*3/uL — ABNORMAL HIGH (ref 4.0–10.5)
nRBC: 0 % (ref 0.0–0.2)

## 2022-01-19 LAB — HEPATITIS C ANTIBODY: HCV Ab: NONREACTIVE

## 2022-01-19 LAB — TYPE AND SCREEN
ABO/RH(D): O POS
Antibody Screen: NEGATIVE

## 2022-01-19 MED ORDER — SOD CITRATE-CITRIC ACID 500-334 MG/5ML PO SOLN
30.0000 mL | ORAL | Status: DC | PRN
  Administered 2022-01-19: 30 mL via ORAL
  Filled 2022-01-19: qty 30

## 2022-01-19 MED ORDER — EPHEDRINE 5 MG/ML INJ: 10.0000 mg | INTRAVENOUS | Status: DC | PRN

## 2022-01-19 MED ORDER — OXYTOCIN-SODIUM CHLORIDE 30-0.9 UT/500ML-% IV SOLN
2.5000 [IU]/h | INTRAVENOUS | Status: DC
  Administered 2022-01-20: 2.5 [IU]/h via INTRAVENOUS

## 2022-01-19 MED ORDER — ONDANSETRON HCL 4 MG/2ML IJ SOLN: 4.0000 mg | Freq: Four times a day (QID) | INTRAMUSCULAR | Status: DC | PRN

## 2022-01-19 MED ORDER — NICOTINE 14 MG/24HR TD PT24
14.0000 mg | MEDICATED_PATCH | Freq: Every day | TRANSDERMAL | Status: DC
  Administered 2022-01-19: 14 mg via TRANSDERMAL
  Filled 2022-01-19: qty 1

## 2022-01-19 MED ORDER — LACTATED RINGERS IV SOLN: 500.0000 mL | INTRAVENOUS | Status: DC | PRN

## 2022-01-19 MED ORDER — LIDOCAINE HCL (PF) 1 % IJ SOLN: 30.0000 mL | INTRAMUSCULAR | Status: DC | PRN

## 2022-01-19 MED ORDER — OXYCODONE-ACETAMINOPHEN 5-325 MG PO TABS: 2.0000 | ORAL_TABLET | ORAL | Status: DC | PRN

## 2022-01-19 MED ORDER — OXYTOCIN-SODIUM CHLORIDE 30-0.9 UT/500ML-% IV SOLN
1.0000 m[IU]/min | INTRAVENOUS | Status: DC
  Administered 2022-01-19: 2 m[IU]/min via INTRAVENOUS
  Filled 2022-01-19: qty 500

## 2022-01-19 MED ORDER — PHENYLEPHRINE 80 MCG/ML (10ML) SYRINGE FOR IV PUSH (FOR BLOOD PRESSURE SUPPORT): 80.0000 ug | PREFILLED_SYRINGE | INTRAVENOUS | Status: DC | PRN

## 2022-01-19 MED ORDER — LIDOCAINE HCL (PF) 1 % IJ SOLN
INTRAMUSCULAR | Status: DC | PRN
  Administered 2022-01-19 (×2): 5 mL via EPIDURAL

## 2022-01-19 MED ORDER — OXYCODONE-ACETAMINOPHEN 5-325 MG PO TABS: 1.0000 | ORAL_TABLET | ORAL | Status: DC | PRN

## 2022-01-19 MED ORDER — MISOPROSTOL 25 MCG QUARTER TABLET: 25.0000 ug | ORAL_TABLET | ORAL | Status: DC | PRN

## 2022-01-19 MED ORDER — LACTATED RINGERS IV SOLN
500.0000 mL | Freq: Once | INTRAVENOUS | Status: AC
  Administered 2022-01-19: 500 mL via INTRAVENOUS

## 2022-01-19 MED ORDER — TERBUTALINE SULFATE 1 MG/ML IJ SOLN: 0.2500 mg | Freq: Once | INTRAMUSCULAR | Status: DC | PRN

## 2022-01-19 MED ORDER — FENTANYL-BUPIVACAINE-NACL 0.5-0.125-0.9 MG/250ML-% EP SOLN
12.0000 mL/h | EPIDURAL | Status: DC | PRN
  Administered 2022-01-19: 12 mL/h via EPIDURAL
  Filled 2022-01-19: qty 250

## 2022-01-19 MED ORDER — ACETAMINOPHEN 325 MG PO TABS: 650.0000 mg | ORAL_TABLET | ORAL | Status: DC | PRN

## 2022-01-19 MED ORDER — LACTATED RINGERS IV SOLN: INTRAVENOUS | Status: DC

## 2022-01-19 MED ORDER — DIPHENHYDRAMINE HCL 50 MG/ML IJ SOLN: 12.5000 mg | INTRAMUSCULAR | Status: DC | PRN

## 2022-01-19 MED ORDER — OXYTOCIN BOLUS FROM INFUSION
333.0000 mL | Freq: Once | INTRAVENOUS | Status: AC
  Administered 2022-01-20: 333 mL via INTRAVENOUS

## 2022-01-19 NOTE — Anesthesia Preprocedure Evaluation (Signed)
Anesthesia Evaluation  Patient identified by MRN, date of birth, ID band Patient awake    Reviewed: Allergy & Precautions, H&P , NPO status , Patient's Chart, lab work & pertinent test results  Airway Mallampati: II  TM Distance: >3 FB Neck ROM: Full    Dental no notable dental hx. (+) Dental Advisory Given   Pulmonary neg pulmonary ROS, Current Smoker   Pulmonary exam normal        Cardiovascular negative cardio ROS Normal cardiovascular exam     Neuro/Psych negative neurological ROS  negative psych ROS   GI/Hepatic negative GI ROS, Neg liver ROS,,,  Endo/Other  negative endocrine ROS    Renal/GU negative Renal ROS  negative genitourinary   Musculoskeletal negative musculoskeletal ROS (+)    Abdominal   Peds negative pediatric ROS (+)  Hematology negative hematology ROS (+)   Anesthesia Other Findings   Reproductive/Obstetrics negative OB ROS                             Anesthesia Physical Anesthesia Plan  ASA: 2  Anesthesia Plan: Epidural   Post-op Pain Management:    Induction:   PONV Risk Score and Plan:   Airway Management Planned: Natural Airway  Additional Equipment:   Intra-op Plan:   Post-operative Plan:   Informed Consent: I have reviewed the patients History and Physical, chart, labs and discussed the procedure including the risks, benefits and alternatives for the proposed anesthesia with the patient or authorized representative who has indicated his/her understanding and acceptance.       Plan Discussed with: Anesthesiologist  Anesthesia Plan Comments:        Anesthesia Quick Evaluation

## 2022-01-19 NOTE — MAU Note (Signed)
.  [name] is a 31 y.o. at [redacted]w[redacted]d here in MAU reporting: ctx for a few days that were mild now unable to sleep through them. Less than 10 min aprt. Reports leaking when she coughs but doesn't think her water is broke. Good fetal movement reported. 1cm on last check.  LMP:  Onset of complaint: today Pain score: 7  Vitals:   01/19/22 1356  BP: 103/64  Pulse: (!) 115  Resp: 18  Temp: 98 F (36.7 C)     FHT:161 Lab orders placed from triage:

## 2022-01-19 NOTE — Progress Notes (Signed)
Pt comfortable with epidural BP 113/65   Pulse 96   Temp 98.1 F (36.7 C) (Oral)   Resp 15   Ht 5\' 1"  (1.549 m)   Wt 69.4 kg   SpO2 99%   BMI 28.91 kg/m  Cat 1 NST Toco q 1-3 min Cx 2/100/-3 Cervical band loosenend pt went from 1 to 2 cm Continue pitocin Anticipate SVD

## 2022-01-19 NOTE — H&P (Signed)
[name] is a 31 y.o. female presenting for labor check.  No cervical change however NST NR and she is scheduled for induction in 2 days.  No LOF or VB.  Good FM. OB History     Gravida  4   Para  3   Term  3   Preterm  0   AB  0   Living  3      SAB  0   IAB  0   Ectopic  0   Multiple  0   Live Births  1          Past Medical History:  Diagnosis Date   Anxiety    HPV (human papilloma virus) infection    Ovarian cyst 02/08/2010   Shingles    Vaginal Pap smear, abnormal    Past Surgical History:  Procedure Laterality Date   CERVICAL BIOPSY     CERVICAL CONIZATION W/BX N/A 05/13/2015   Procedure: CONIZATION CERVIX WITH BIOPSY;  Surgeon: Carrington Clamp, MD;  Location: [facility];  Service: Gynecology;  Laterality: N/A;   LACERATION REPAIR     neck   LEEP N/A 05/13/2015   Procedure: LOOP ELECTROSURGICAL EXCISION PROCEDURE (LEEP);  Surgeon: Carrington Clamp, MD;  Location: [facility];  Service: Gynecology;  Laterality: N/A;   Family History: family history includes Cancer in her maternal aunt and mother. Social History:  reports that she has been smoking cigarettes. She has a 5.00 pack-year smoking history. She has never used smokeless tobacco. She reports that she does not drink alcohol and does not use drugs.     Maternal Diabetes: No Genetic Screening: Normal Maternal Ultrasounds/Referrals: Normal Fetal Ultrasounds or other Referrals:  None Maternal Substance Abuse:  Yes:  Type: Marijuana, Other:  Significant Maternal Medications:  None Significant Maternal Lab Results:  Group B Strep negative Number of Prenatal Visits:greater than 3 verified prenatal visits Other Comments:  None  Review of Systems History Dilation: 1 Effacement (%): 100 Station: -2 Exam by:: h stone rnc Blood pressure 108/68, pulse 83, temperature 98.1 F (36.7 C), temperature source Oral, resp. rate 16, height 5\' 1"  (1.549 m), weight 69.4 kg. Exam Physical Exam  Physical  Examination: General appearance - alert, well appearing, and in no distress Chest - clear to auscultation, no wheezes, rales or rhonchi, symmetric air entry Heart - normal rate and regular rhythm Abdomen - soft, nontender, nondistended, no masses or organomegaly Extremities - Homan's sign negative bilaterally  Prenatal labs: ABO, Rh: --/--/O POS (12/12 1453) Antibody: NEG (12/12 1453) Rubella: Immune (07/26 0000) RPR: Nonreactive (09/25 0000)  HBsAg: Negative (07/26 0000)  HIV: Non-reactive, Non-reactive (09/25 0000)  GBS: Negative/-- (11/02 0000)   Assessment/Plan: Admit for IOL past due Pitocin' Epidural prn Anticipate SVD GNS neg   Chester Sibert A Yeva Bissette 01/19/2022, 4:47 PM

## 2022-01-19 NOTE — MAU Provider Note (Signed)
Event Date/Time   First Provider Initiated Contact with Patient 01/19/22 1432       S: Ms. [name] is a 31 y.o. [phone] at [redacted]w[redacted]d  who presents to MAU today complaining contractions less than 10 minutes apart for the past few days. She denies vaginal bleeding. She endorses leaking while coughing but denies ongoing LOF. She reports normal fetal movement.    Patient receives care with [facility]  O: BP 103/64   Pulse (!) 115   Temp 98 F (36.7 C)   Resp 18   Ht 5\' 1"  (1.549 m)   Wt 69.7 kg   BMI 29.02 kg/m  GENERAL: Well-developed, well-nourished female in no acute distress.  HEAD: Normocephalic, atraumatic.  CHEST: Normal effort of breathing, regular heart rate ABDOMEN: Soft, nontender, gravid  Cervical exam: 1cm (per RN)     Fetal Monitoring: Baseline: 145 Variability: Mod Accelerations: 10 x 10 during first 30 min of tracing Decelerations: N/A Contractions: Occasional   A: SIUP at [redacted]w[redacted]d  GBS Neg Cervix remains 1cm per RN check CNM at bedside due to NR NST, patient amenable to IOL PRN Dr. [redacted]w[redacted]d notified, present at bedside at 1444  P: Per Dr. Normand Sloop, admit to L&D  Normand Sloop, MSA, MSN, CNM 01/19/2022 3:00 PM

## 2022-01-19 NOTE — Anesthesia Procedure Notes (Signed)
Epidural Patient location during procedure: OB Start time: 01/19/2022 5:49 PM End time: 01/19/2022 6:02 PM  Staffing Anesthesiologist: Heather Roberts, MD Performed: anesthesiologist   Preanesthetic Checklist Completed: patient identified, IV checked, site marked, risks and benefits discussed, monitors and equipment checked, pre-op evaluation and timeout performed  Epidural Patient position: sitting Prep: DuraPrep Patient monitoring: heart rate, cardiac monitor, continuous pulse ox and blood pressure Approach: midline Location: L2-L3 Injection technique: LOR saline  Needle:  Needle type: Tuohy  Needle gauge: 17 G Needle length: 9 cm Needle insertion depth: 5 cm Catheter size: 20 Guage Catheter at skin depth: 10 cm Test dose: negative and Other  Assessment Events: blood not aspirated, no cerebrospinal fluid, injection not painful, no injection resistance and negative IV test  Additional Notes Informed consent obtained prior to proceeding including risk of failure, 1% risk of PDPH, risk of minor discomfort and bruising.  Discussed rare but serious complications including epidural abscess, permanent nerve injury, epidural hematoma.  Discussed alternatives to epidural analgesia and patient desires to proceed.  Timeout performed pre-procedure verifying patient name, procedure, and platelet count.  Patient tolerated procedure well.

## 2022-01-20 ENCOUNTER — Encounter (HOSPITAL_COMMUNITY): Payer: Self-pay | Admitting: Obstetrics and Gynecology

## 2022-01-20 LAB — CBC
HCT: 31.1 % — ABNORMAL LOW (ref 36.0–46.0)
Hemoglobin: 10.6 g/dL — ABNORMAL LOW (ref 12.0–15.0)
MCH: 31.7 pg (ref 26.0–34.0)
MCHC: 34.1 g/dL (ref 30.0–36.0)
MCV: 93.1 fL (ref 80.0–100.0)
Platelets: 312 10*3/uL (ref 150–400)
RBC: 3.34 MIL/uL — ABNORMAL LOW (ref 3.87–5.11)
RDW: 12.6 % (ref 11.5–15.5)
WBC: 16.7 10*3/uL — ABNORMAL HIGH (ref 4.0–10.5)
nRBC: 0 % (ref 0.0–0.2)

## 2022-01-20 LAB — RPR: RPR Ser Ql: NONREACTIVE

## 2022-01-20 MED ORDER — MEASLES, MUMPS & RUBELLA VAC IJ SOLR: 0.5000 mL | Freq: Once | INTRAMUSCULAR | Status: DC

## 2022-01-20 MED ORDER — OXYCODONE HCL 5 MG PO TABS: 5.0000 mg | ORAL_TABLET | Freq: Four times a day (QID) | ORAL | Status: DC | PRN

## 2022-01-20 MED ORDER — SENNOSIDES-DOCUSATE SODIUM 8.6-50 MG PO TABS
2.0000 | ORAL_TABLET | Freq: Every day | ORAL | Status: DC
  Administered 2022-01-21: 2 via ORAL
  Filled 2022-01-20: qty 2

## 2022-01-20 MED ORDER — SIMETHICONE 80 MG PO CHEW: 80.0000 mg | CHEWABLE_TABLET | ORAL | Status: DC | PRN

## 2022-01-20 MED ORDER — DIBUCAINE (PERIANAL) 1 % EX OINT: 1.0000 | TOPICAL_OINTMENT | CUTANEOUS | Status: DC | PRN

## 2022-01-20 MED ORDER — COCONUT OIL OIL: 1.0000 | TOPICAL_OIL | Status: DC | PRN

## 2022-01-20 MED ORDER — INFLUENZA VAC SPLIT QUAD 0.5 ML IM SUSY
0.5000 mL | PREFILLED_SYRINGE | INTRAMUSCULAR | Status: AC
  Administered 2022-01-21: 0.5 mL via INTRAMUSCULAR
  Filled 2022-01-20: qty 0.5

## 2022-01-20 MED ORDER — WITCH HAZEL-GLYCERIN EX PADS: 1.0000 | MEDICATED_PAD | CUTANEOUS | Status: DC | PRN

## 2022-01-20 MED ORDER — OXYCODONE HCL 5 MG PO TABS
5.0000 mg | ORAL_TABLET | Freq: Four times a day (QID) | ORAL | Status: DC | PRN
  Administered 2022-01-20 – 2022-01-21 (×4): 5 mg via ORAL
  Filled 2022-01-20 (×4): qty 1

## 2022-01-20 MED ORDER — OXYCODONE HCL 5 MG PO TABS
5.0000 mg | ORAL_TABLET | Freq: Once | ORAL | Status: AC
  Administered 2022-01-20: 10 mg via ORAL
  Filled 2022-01-20: qty 2

## 2022-01-20 MED ORDER — IBUPROFEN 600 MG PO TABS
600.0000 mg | ORAL_TABLET | Freq: Four times a day (QID) | ORAL | Status: DC
  Administered 2022-01-20 – 2022-01-21 (×5): 600 mg via ORAL
  Filled 2022-01-20 (×5): qty 1

## 2022-01-20 MED ORDER — BENZOCAINE-MENTHOL 20-0.5 % EX AERO
1.0000 | INHALATION_SPRAY | CUTANEOUS | Status: DC | PRN
  Administered 2022-01-20: 1 via TOPICAL
  Filled 2022-01-20: qty 56

## 2022-01-20 MED ORDER — TETANUS-DIPHTH-ACELL PERTUSSIS 5-2.5-18.5 LF-MCG/0.5 IM SUSY: 0.5000 mL | PREFILLED_SYRINGE | Freq: Once | INTRAMUSCULAR | Status: DC

## 2022-01-20 MED ORDER — ACETAMINOPHEN 325 MG PO TABS
650.0000 mg | ORAL_TABLET | ORAL | Status: DC | PRN
  Administered 2022-01-20 – 2022-01-21 (×4): 650 mg via ORAL
  Filled 2022-01-20 (×4): qty 2

## 2022-01-20 MED ORDER — PNEUMOCOCCAL 20-VAL CONJ VACC 0.5 ML IM SUSY
0.5000 mL | PREFILLED_SYRINGE | INTRAMUSCULAR | Status: AC
  Administered 2022-01-21: 0.5 mL via INTRAMUSCULAR
  Filled 2022-01-20: qty 0.5

## 2022-01-20 MED ORDER — ZOLPIDEM TARTRATE 5 MG PO TABS: 5.0000 mg | ORAL_TABLET | Freq: Every evening | ORAL | Status: DC | PRN

## 2022-01-20 MED ORDER — ONDANSETRON HCL 4 MG PO TABS: 4.0000 mg | ORAL_TABLET | ORAL | Status: DC | PRN

## 2022-01-20 MED ORDER — DIPHENHYDRAMINE HCL 25 MG PO CAPS: 25.0000 mg | ORAL_CAPSULE | Freq: Four times a day (QID) | ORAL | Status: DC | PRN

## 2022-01-20 MED ORDER — PRENATAL MULTIVITAMIN CH
1.0000 | ORAL_TABLET | Freq: Every day | ORAL | Status: DC
  Administered 2022-01-20 – 2022-01-21 (×2): 1 via ORAL
  Filled 2022-01-20 (×2): qty 1

## 2022-01-20 MED ORDER — ONDANSETRON HCL 4 MG/2ML IJ SOLN: 4.0000 mg | INTRAMUSCULAR | Status: DC | PRN

## 2022-01-20 NOTE — Anesthesia Postprocedure Evaluation (Signed)
Anesthesia Post Note  Patient: MOHINI HEATHCOCK  Procedure(s) Performed: AN AD HOC LABOR EPIDURAL     Patient location during evaluation: Mother Baby Anesthesia Type: Epidural Level of consciousness: awake and alert Pain management: pain level controlled Vital Signs Assessment: post-procedure vital signs reviewed and stable Respiratory status: spontaneous breathing, nonlabored ventilation and respiratory function stable Cardiovascular status: stable Postop Assessment: no headache, no backache and epidural receding Anesthetic complications: no   No notable events documented.  Last Vitals:  Vitals:   01/20/22 0203 01/20/22 0310  BP: 117/60 92/64  Pulse: 68 73  Resp: 18 18  Temp: 36.8 C 36.7 C  SpO2: 100% 99%    Last Pain:  Vitals:   01/20/22 0736  TempSrc:   PainSc: 4    Pain Goal:                   Raman Featherston

## 2022-01-20 NOTE — Progress Notes (Signed)
Post Partum Day 0 Subjective: no complaints, up ad lib, voiding, tolerating PO, and + flatus  Objective: Blood pressure 110/66, pulse 70, temperature 98.1 F (36.7 C), temperature source Oral, resp. rate 18, height 5\' 1"  (1.549 m), weight 69.4 kg, SpO2 99 %, unknown if currently breastfeeding.  Physical Exam:  General: alert, cooperative, and no distress Lochia: appropriate Uterine Fundus: firm, mild TTP Incision: n/a DVT Evaluation: No evidence of DVT seen on physical exam.  Recent Labs    01/19/22 1606 01/20/22 0508  HGB 12.3 10.6*  HCT 35.6* 31.1*    Assessment/Plan: Plan for discharge tomorrow, Breastfeeding, and Lactation consult Patient undecided on circumcision   LOS: 1 day   01/22/22, MD 01/20/2022, 1:32 PM

## 2022-01-21 ENCOUNTER — Inpatient Hospital Stay (HOSPITAL_COMMUNITY): Payer: Medicaid Other

## 2022-01-21 ENCOUNTER — Inpatient Hospital Stay (HOSPITAL_COMMUNITY)
Admission: RE | Admit: 2022-01-21 | Payer: Medicaid Other | Source: Home / Self Care | Admitting: Obstetrics & Gynecology

## 2022-01-21 DIAGNOSIS — Z8659 Personal history of other mental and behavioral disorders: Secondary | ICD-10-CM

## 2022-01-21 DIAGNOSIS — Z3490 Encounter for supervision of normal pregnancy, unspecified, unspecified trimester: Secondary | ICD-10-CM | POA: Diagnosis present

## 2022-01-21 MED ORDER — IBUPROFEN 600 MG PO TABS: 600.0000 mg | ORAL_TABLET | Freq: Four times a day (QID) | ORAL | 0 refills | Status: AC

## 2022-01-21 MED ORDER — MEDROXYPROGESTERONE ACETATE 150 MG/ML IM SUSP
150.0000 mg | Freq: Once | INTRAMUSCULAR | Status: AC
  Administered 2022-01-21: 150 mg via INTRAMUSCULAR
  Filled 2022-01-21: qty 1

## 2022-01-21 NOTE — Progress Notes (Signed)
Patient feels that breastfeeding is going well, her milk volume is increasing and she is hearing swallowing with feedings. Given hand pump and mother reports she knows how to use from past experience. Patient declines lactation services.

## 2022-01-21 NOTE — Discharge Summary (Signed)
SVD OB Discharge Summary       Patient Name: [name] DOB: 05/04/1990 MRN: [id number]  Date of admission: 01/19/2022 Delivering MD: Jaymes Graff  Date of delivery: 12/13 Type of delivery: SVD  Newborn Data: Sex: Baby female Circumcision: No circ desired Live born female  Birth Weight: 7 lb 14 oz (3572 g) APGAR: 7, 8  Newborn Delivery   Birth date/time: 01/20/2022 00:14:00 Delivery type: Vaginal, Spontaneous      Feeding: breast and bottle Infant being discharge to home with mother in stable condition.   Admitting diagnosis: Braxton Hicks contractions [O47.9] Intrauterine pregnancy: [redacted]w[redacted]d     Secondary diagnosis:  Active Problems:   Encounter for induction of labor   SVD (spontaneous vaginal delivery)   Normal postpartum course   H/O anxiety disorder                                Complications: None                                                              Intrapartum Procedures: spontaneous vaginal delivery Postpartum Procedures: none Complications-Operative and Postpartum: none Augmentation: Pitocin   History of Present Illness: Ms. [name] is a 31 y.o. female, [id number], who presents at [redacted]w[redacted]d weeks gestation. The patient has been followed at  [facility]  Her pregnancy has been complicated by:  Patient Active Problem List   Diagnosis Date Noted   Encounter for induction of labor 01/21/2022   SVD (spontaneous vaginal delivery) 01/21/2022   Normal postpartum course 01/21/2022   H/O anxiety disorder 01/21/2022   Complication of intrauterine device (IUD) (HCC) 03/13/2015   Pelvic pain in female 03/13/2015   Postpartum state 12/23/2014     Active Ambulatory Problems    Diagnosis Date Noted   Postpartum state 12/23/2014   Complication of intrauterine device (IUD) (HCC) 03/13/2015   Pelvic pain in female 03/13/2015   Resolved Ambulatory Problems    Diagnosis Date Noted   No Resolved Ambulatory Problems    Past Medical History:  Diagnosis Date   Anxiety    HPV (human papilloma virus) infection    Ovarian cyst 02/08/2010   Shingles    Vaginal Pap smear, abnormal      Hospital course:  Induction of Labor With Vaginal Delivery   31 y.o. yo [id number] at [redacted]w[redacted]d was admitted to the hospital 01/19/2022 for induction of labor.  Indication for induction: Postdates.  Patient had an labor course complicated by was admitted on 12/12 for later term at 40.6 weeks, progressed with pitocin, gbs-, had svd on 12/13 @ 0014 over intact perineum, ebl was hgb drop of 12.3-10.6, asymptomatic, h/o anxiety mood stable, no meds.  Membrane Rupture Time/Date: 11:57 PM ,01/19/2022   Delivery Method:Vaginal, Spontaneous  Episiotomy: None  Lacerations:  None  Details of delivery can be found in separate delivery note.  Patient had a postpartum course complicated bynothing. Patient is discharged home 01/21/22.  Newborn Data: Birth date:01/20/2022  Birth time:12:14 AM  Gender:Female  Living status:Living  Apgars:7 ,8  Weight:3572 g  Postpartum Day # 1 : S/P NSVD due to see above. Patient up ad lib, denies syncope or dizziness. Reports consuming  regular diet without issues and denies N/V. Patient reports 0 bowel movement + passing flatus.  Denies issues with urination and reports bleeding is "lighter."  Patient is BrBt feeding and reports going well.  Desires depot for postpartum contraception prior to discharge, discussed with pt would not recommend pt taking or continue to take depot r/t bone loss, r/t pt been on depot for >2 years already..  Pain is being appropriately managed with use of po meds. Denies SI/HI, appears happy and mood stable, discussed PPD risk with h/o anxiety offered SSRI, pt declined, will have one week modd check with [facility]. .   Physical exam  Vitals:   01/20/22 1200 01/20/22 1513 01/20/22 2139 01/21/22 0506  BP: 110/66 110/65 109/68 99/71  Pulse: 70 (!) 56 62 71  Resp: 18 18  16   Temp: 98.1  F (36.7 C) 97.8 F (36.6 C) 98 F (36.7 C) 98.3 F (36.8 C)  TempSrc: Oral Oral Oral Oral  SpO2: 99% 100%    Weight:      Height:       General: alert, cooperative, and no distress Lochia: appropriate Uterine Fundus: firm Incision: Healing well with no significant drainage, No significant erythema, Dressing is clean, dry, and intact, honeycomb dressing CDI Perineum: intact DVT Evaluation: No evidence of DVT seen on physical exam. Negative Homan's sign. No cords or calf tenderness.  Labs: Lab Results  Component Value Date   WBC 16.7 (H) 01/20/2022   HGB 10.6 (L) 01/20/2022   HCT 31.1 (L) 01/20/2022   MCV 93.1 01/20/2022   PLT 312 01/20/2022      Latest Ref Rng & Units 10/29/2018    5:45 PM  CMP  Glucose 70 - 99 mg/dL 97   BUN 6 - 20 mg/dL 7   Creatinine 10/31/2018 - 2.54 mg/dL 2.70   Sodium 6.23 - 762 mmol/L 140   Potassium 3.5 - 5.1 mmol/L 3.2   Chloride 98 - 111 mmol/L 105   CO2 22 - 32 mmol/L 29   Calcium 8.9 - 10.3 mg/dL 8.8   Total Protein 6.5 - 8.1 g/dL 6.2   Total Bilirubin 0.3 - 1.2 mg/dL 0.2   Alkaline Phos 38 - 126 U/L 36   AST 15 - 41 U/L 13   ALT 0 - 44 U/L 13     Date of discharge: 01/21/2022 Discharge Diagnoses: Term Pregnancy-delivered Discharge instruction: per After Visit Summary and "Baby and Me Booklet".  After visit meds:   Activity:           unrestricted and pelvic rest Advance as tolerated. Pelvic rest for 6 weeks.  Diet:                routine Medications: PNV and Ibuprofen Postpartum contraception: Depo Provera Condition:  Pt discharge to home with baby in stable condition   Meds: Allergies as of 01/21/2022       Reactions   Bee Venom Anaphylaxis   Tramadol Itching, Nausea And Vomiting        Medication List     STOP taking these medications    cyclobenzaprine 5 MG tablet Commonly known as: FLEXERIL   dicyclomine 20 MG tablet Commonly known as: BENTYL   ondansetron 4 MG tablet Commonly known as: Zofran    pseudoephedrine 120 MG 12 hr tablet Commonly known as: SUDAFED       TAKE these medications    acetaminophen 500 MG tablet Commonly known as: TYLENOL Take 1,000 mg by mouth every 6 (six) hours  as needed.   cholecalciferol 25 MCG (1000 UNIT) tablet Commonly known as: VITAMIN D3 Take 1,000 Units by mouth daily.   EpiPen 2-Pak 0.3 mg/0.3 mL Soaj injection Generic drug: EPINEPHrine Inject 0.3 mg into the muscle once as needed (for severe allergic reaction).   ibuprofen 600 MG tablet Commonly known as: ADVIL Take 1 tablet (600 mg total) by mouth every 6 (six) hours.   multivitamin-prenatal 27-0.8 MG Tabs tablet Take 1 tablet by mouth daily at 12 noon.        Discharge Follow Up:   Follow-up Information     [facility]. Schedule an appointment as soon as possible for a visit in 1 week(s).   Specialty: Obstetrics and Gynecology Why: 1 week mood check and 6 weeks PPV Contact information: [address] [place] [address] [phone]                 [facility] CNM, FNP-C, PMHNP-BC  [address]  [place], [place] [address]  Cell: [phone]  Office Phone: [phone] Fax: [phone] 01/21/2022  9:36 AM

## 2022-01-21 NOTE — Clinical Social Work Maternal (Signed)
CLINICAL SOCIAL WORK MATERNAL/CHILD NOTE  Patient Details  Name: [name] MRN: [id number] Date of Birth: 10-29-1990  Date:  01/21/2022  Clinical Social Worker Initiating Note:  Bernadene Bell Esias Mory Date/Time: Initiated:  01/21/22/1358     Child's Name:  [name]   Biological Parents:  Mother [name] 12/09/88)   Need for Interpreter:  None   Reason for Referral:  Current Substance Use/Substance Use During Pregnancy     Address:  [address] [place] [address]    Phone number:  [phone] (home)     Additional phone number: [phone]  Household Members/Support Persons (HM/SP):   Household Member/Support Person 1, Household Member/Support Person 2   HM/SP Name Relationship DOB or Age  HM/SP -1 [name] son 12/23/2014  HM/SP -[place] older sons father 40  HM/SP -3        HM/SP -4        HM/SP -5        HM/SP -6        HM/SP -7        HM/SP -8          Natural Supports (not living in the home):  Extended Family   Professional Supports: None   Employment: Unemployed   Type of Work:     Education:  9 to 11 years   Homebound arranged: No  Financial Resources:  [employer]   Other Resources:  Arts development officer Considerations Which May Impact Care:    Strengths:  Ability to meet basic needs  , Engineer, materials, Home prepared for child     Psychotropic Medications:         Pediatrician:    [place] (including Technical sales engineer and surronding areas)  Pediatrician List:   [place]      Pediatrician Fax Number:    Risk Factors/Current Problems:  Substance Use  , DHHS Involvement     Cognitive State:  Able to Concentrate  , Alert     Mood/Affect:  Comfortable  , Calm     CSW Assessment: CSW received consult for Marijuana use during pregnancy. CSW met with [name] to complete assessment and offer support.  CSW entered the room and observed [name] in bed breastfeeding the infant. CSW introduced self, CSW role and reason for visit. [name] was agreeable to visit. CSW confirmed demographic information with [name]. [name] provided updated address and phone number :[id number], [place], [place] in [place]. Phone number [phone].  CSW inquired about and MH  diagnosis, [name] reported she has Anxiety and was diagnosed over 5 years ago. [name] reported she does not take any medication and denied any therapy currently. CSW inquired about how [name] copes with anxiety, [name] stated "I just go about life, try not to let It bother me". CSW assessed for safety, [name] denied any SI, HI or DV. CSW provided education regarding the baby blues period vs. perinatal mood disorders, discussed treatment and gave resources for mental health follow up if concerns arise.  CSW recommends self-evaluation during the postpartum time period using the New Mom Checklist from Postpartum Progress and encouraged [name] to contact a medical professional if symptoms are noted at any time. [name] reported having good support from her sister and [place].  CSW inquired about [name] THC use, [name] reported she uses CBD but understands that is  can have THC in it as well. [name] reported her last use was about a week ago. [name] reported she was using CBD to ease her anxiety. CSW informed [name] of hospital drug screen policy and notified [name] infant UDS was positive for THC. CSW notified [name] a CPS report would be made to [facility] voiced understanding and notified CSW she has an open case with [facility] with her older child. [name] reported there is no custody involvement and she still has custody of him. CSW made CPS report to [facility] regarding infant positive UDS. CSW inquired about [name] other 2 children, [name] reported she was younger when she had then and she have been adopted, [name] reported one is with her sister and the other was adopted by na adoptive family.      CSW provided review of Sudden Infant Death Syndrome (SIDS) precautions.  [name] reported she has all necessary items for the infant including a bassinet for him to sleep. CSW identifies no further need for intervention and no barriers to discharge at this time.  CSW Plan/Description:  No Further Intervention Required/No Barriers to Discharge, Sudden Infant Death Syndrome (SIDS) Education, Perinatal Mood and Anxiety Disorder (PMADs) Education, [place], Child Protective Service Report  , CSW Will Continue to Monitor Umbilical Cord Tissue Drug Screen Results and Make Report if [name], LCSW 01/21/2022, 2:04 PM

## 2022-01-23 ENCOUNTER — Inpatient Hospital Stay (HOSPITAL_COMMUNITY)
Admission: AD | Admit: 2022-01-23 | Discharge: 2022-01-23 | Disposition: A | Discharge status: Home / Self Care | Payer: Medicaid Other | Attending: Obstetrics and Gynecology | Admitting: Obstetrics and Gynecology

## 2022-01-23 DIAGNOSIS — O99335 Smoking (tobacco) complicating the puerperium: Secondary | ICD-10-CM | POA: Insufficient documentation

## 2022-01-23 DIAGNOSIS — N939 Abnormal uterine and vaginal bleeding, unspecified: Secondary | ICD-10-CM

## 2022-01-23 DIAGNOSIS — O909 Complication of the puerperium, unspecified: Secondary | ICD-10-CM | POA: Insufficient documentation

## 2022-01-23 LAB — CBC WITH DIFFERENTIAL/PLATELET
Abs Immature Granulocytes: 0.04 10*3/uL (ref 0.00–0.07)
Basophils Absolute: 0.1 10*3/uL (ref 0.0–0.1)
Basophils Relative: 1 %
Eosinophils Absolute: 0.5 10*3/uL (ref 0.0–0.5)
Eosinophils Relative: 4 %
HCT: 35.2 % — ABNORMAL LOW (ref 36.0–46.0)
Hemoglobin: 11.8 g/dL — ABNORMAL LOW (ref 12.0–15.0)
Immature Granulocytes: 0 %
Lymphocytes Relative: 30 %
Lymphs Abs: 3.2 10*3/uL (ref 0.7–4.0)
MCH: 31.1 pg (ref 26.0–34.0)
MCHC: 33.5 g/dL (ref 30.0–36.0)
MCV: 92.9 fL (ref 80.0–100.0)
Monocytes Absolute: 0.6 10*3/uL (ref 0.1–1.0)
Monocytes Relative: 6 %
Neutro Abs: 6.1 10*3/uL (ref 1.7–7.7)
Neutrophils Relative %: 59 %
Platelets: 484 10*3/uL — ABNORMAL HIGH (ref 150–400)
RBC: 3.79 MIL/uL — ABNORMAL LOW (ref 3.87–5.11)
RDW: 12.5 % (ref 11.5–15.5)
WBC: 10.5 10*3/uL (ref 4.0–10.5)
nRBC: 0 % (ref 0.0–0.2)

## 2022-01-23 MED ORDER — OXYCODONE-ACETAMINOPHEN 5-325 MG PO TABS
1.0000 | ORAL_TABLET | Freq: Once | ORAL | Status: AC
  Administered 2022-01-23: 1 via ORAL
  Filled 2022-01-23: qty 1

## 2022-01-23 NOTE — MAU Provider Note (Signed)
Chief Complaint:  Vaginal Bleeding   Seen by provider at 2020    HPI: [name] is a 31 y.o. [phone] who is 3 days post vaginal delivery presents to maternity admissions reporting passing a large clot at home. Was told by family she might be hemorrhaging.  Not having increased bleeding otherwise.  No problems with bleeding after delivery. Cramping a lot since delivery. . She reports vaginal bleeding, denies vaginal itching/burning, urinary symptoms, h/a, dizziness, n/v, or fever/chills.    Vaginal Bleeding The patient's primary symptoms include pelvic pain and vaginal bleeding. The patient's pertinent negatives include no genital itching or genital odor. The current episode started today. The problem has been resolved. She is not pregnant. Pertinent negatives include no chills, constipation, diarrhea, dysuria, fever, frequency or nausea. The vaginal discharge was bloody. The vaginal bleeding is lighter than menses. She has been passing clots. She has not been passing tissue. Nothing aggravates the symptoms. She has tried nothing for the symptoms.   RN Note: [name] is a 31 y.o. postpartum from vag del on 12/13. Denies any problems with labor and delivery. Here in MAU reporting passing a large clot about ago. Took a pic and pt describes as the size of her fist. Some abdominal cramping. No excessive bleeding since passing the clot.   Onset of complaint:               Pain score: 7  Past Medical History: Past Medical History:  Diagnosis Date   Anxiety    HPV (human papilloma virus) infection    Ovarian cyst 02/08/2010   Shingles    Vaginal Pap smear, abnormal     Past obstetric history: OB History  Gravida Para Term Preterm AB Living  4 4 4  0 0 4  SAB IAB Ectopic Multiple Live Births  0 0 0 0 2    # Outcome Date GA Lbr Len/2nd Weight Sex Delivery Anes PTL Lv  4 Term 01/20/22 [redacted]w[redacted]d 01:27 / 00:17 3572 g M Vag-Spont EPI  LIV     Birth Comments: WNL  3 Term  12/23/14 [redacted]w[redacted]d 22:13 / 00:07 3340 g M Vag-Spont EPI  LIV  2 Term      Vag-Spont     1 Term      Vag-Spont       Past Surgical History: Past Surgical History:  Procedure Laterality Date   CERVICAL BIOPSY     CERVICAL CONIZATION W/BX N/A 05/13/2015   Procedure: CONIZATION CERVIX WITH BIOPSY;  Surgeon: 07/13/2015, MD;  Location: [facility];  Service: Gynecology;  Laterality: N/A;   LACERATION REPAIR     neck   LEEP N/A 05/13/2015   Procedure: LOOP ELECTROSURGICAL EXCISION PROCEDURE (LEEP);  Surgeon: 07/13/2015, MD;  Location: [facility];  Service: Gynecology;  Laterality: N/A;    Family History: Family History  Problem Relation Age of Onset   Cancer Mother    Cancer Maternal Aunt     Social History: Social History   Tobacco Use   Smoking status: Every Day    Packs/day: 0.50    Years: 10.00    Total pack years: 5.00    Types: Cigarettes   Smokeless tobacco: Never  Vaping Use   Vaping Use: Never used  Substance Use Topics   Alcohol use: No   Drug use: No    Allergies:  Allergies  Allergen Reactions   Bee Venom Anaphylaxis   Tramadol Itching and Nausea And Vomiting  Meds:  Medications Prior to Admission  Medication Sig Dispense Refill Last Dose   acetaminophen (TYLENOL) 500 MG tablet Take 1,000 mg by mouth every 6 (six) hours as needed.      cholecalciferol (VITAMIN D3) 25 MCG (1000 UNIT) tablet Take 1,000 Units by mouth daily.      EPINEPHrine (EPIPEN 2-PAK) 0.3 mg/0.3 mL IJ SOAJ injection Inject 0.3 mg into the muscle once as needed (for severe allergic reaction).      ibuprofen (ADVIL) 600 MG tablet Take 1 tablet (600 mg total) by mouth every 6 (six) hours. 30 tablet 0    Prenatal Vit-Fe Fumarate-FA (MULTIVITAMIN-PRENATAL) 27-0.8 MG TABS tablet Take 1 tablet by mouth daily at 12 noon.       I have reviewed patient's Past Medical Hx, Surgical Hx, Family Hx, Social Hx, medications and allergies.  ROS:  Review of Systems  Constitutional:  Negative for chills  and fever.  Gastrointestinal:  Negative for constipation, diarrhea and nausea.  Genitourinary:  Positive for pelvic pain and vaginal bleeding. Negative for dysuria and frequency.   Other systems negative     Physical Exam  Patient Vitals for the past 24 hrs:  BP Temp Pulse Resp SpO2 Height Weight  01/23/22 2010 (!) 108/56 -- -- -- -- -- --  01/23/22 2007 -- 98.2 F (36.8 C) 84 16 99 % 5\' 1"  (1.549 m) 64.4 kg   Constitutional: Well-developed, well-nourished female in no acute distress.  Cardiovascular: normal rate  Respiratory: normal effort, no distress.  GI: Abd soft, non-tender.  Nondistended.  No rebound, No guarding.   MS: Extremities nontender, no edema, normal ROM Neurologic: Alert and oriented x 4.   Grossly nonfocal. GU: Neg CVAT.    Fundus firm at umbilicus.  Mild to moderate tenderness over lower abdomen. Skin:  Warm and Dry Psych:  Affect appropriate. Pelvic deferred since not currently bleeding much   Labs: Results for orders placed or performed during the hospital encounter of 01/23/22 (from the past 24 hour(s))  CBC with Differential/Platelet     Status: Abnormal   Collection Time: 01/23/22  8:49 PM  Result Value Ref Range   WBC 10.5 4.0 - 10.5 K/uL   RBC 3.79 (L) 3.87 - 5.11 MIL/uL   Hemoglobin 11.8 (L) 12.0 - 15.0 g/dL   HCT 01/25/22 (L) 85.0 - 27.7 %   MCV 92.9 80.0 - 100.0 fL   MCH 31.1 26.0 - 34.0 pg   MCHC 33.5 30.0 - 36.0 g/dL   RDW 41.2 87.8 - 67.6 %   Platelets 484 (H) 150 - 400 K/uL   nRBC 0.0 0.0 - 0.2 %   Neutrophils Relative % 59 %   Neutro Abs 6.1 1.7 - 7.7 K/uL   Lymphocytes Relative 30 %   Lymphs Abs 3.2 0.7 - 4.0 K/uL   Monocytes Relative 6 %   Monocytes Absolute 0.6 0.1 - 1.0 K/uL   Eosinophils Relative 4 %   Eosinophils Absolute 0.5 0.0 - 0.5 K/uL   Basophils Relative 1 %   Basophils Absolute 0.1 0.0 - 0.1 K/uL   Immature Granulocytes 0 %   Abs Immature Granulocytes 0.04 0.00 - 0.07 K/uL   --/--/O POS (12/12 1453)  Imaging:  No  results found.  MAU Course/MDM: I have reviewed the triage vital signs and the nursing notes.   Pertinent labs & imaging results that were available during my care of the patient were reviewed by me and considered in my medical decision making (see chart for  details).      I have reviewed her medical records including past results, notes and treatments.   I have ordered labs as follows:  CBC which showed increase hemoglobin since delivery and no leukocytosis.  Reports lighter bleeding now, no heavy flow since clot passed, so doubt PP Hemorrhage or endometritis. Imaging ordered: none, would order it if bleeding increases Results reviewed.    Treatments in MAU included Percocet for pain, reports ibuprofen not helping much, especially when breastfeeding.   Pt stable at time of discharge.  Assessment: Postpartum Day #3 Passage of clot without heavy lochia otherwise Normal hemoglobin No leukocytosis or fever, doubt endometritis  Plan: Discharge home Recommend monitor bleeding Followup in office as indicated Encouraged to return here or to other Urgent Care/ED if she develops worsening of symptoms, increase in pain, fever, or other concerning symptoms.   Wynelle Bourgeois CNM, MSN Certified Nurse-Midwife 01/23/2022 9:32 PM

## 2022-01-23 NOTE — MAU Note (Signed)
.  [name] is a 31 y.o. postpartum from vag del on 12/13. Denies any problems with labor and delivery. Here in MAU reporting passing a large clot about ago. Took a pic and pt describes as the size of her fist. Some abdominal cramping. No excessive bleeding since passing the clot.   Onset of complaint: Pain score: 7 Vitals:   01/23/22 2007 01/23/22 2010  BP:  (!) 108/56  Pulse: 84   Resp: 16   Temp: 98.2 F (36.8 C)   SpO2: 99%      FHT:n/a Lab orders placed from triage:  none

## 2022-01-23 NOTE — Progress Notes (Signed)
Written and verbal d/c instructions given and understanding voiced. Understands we usually watch pt's after receiving medications. Pt voices understanding

## 2022-01-28 ENCOUNTER — Telehealth (HOSPITAL_COMMUNITY): Payer: Self-pay | Admitting: *Deleted

## 2022-01-28 NOTE — Telephone Encounter (Signed)
Attempted hospital discharge follow-up call. Voicemail not set up. Deforest Hoyles, RN, 01/28/22, [phone]

## 2022-07-12 ENCOUNTER — Other Ambulatory Visit: Payer: Self-pay

## 2022-07-12 ENCOUNTER — Emergency Department (HOSPITAL_COMMUNITY)
Admission: EM | Admit: 2022-07-12 | Discharge: 2022-07-12 | Disposition: A | Discharge status: Home / Self Care | Payer: Medicaid Other | Attending: Emergency Medicine | Admitting: Emergency Medicine

## 2022-07-12 ENCOUNTER — Encounter (HOSPITAL_COMMUNITY): Payer: Self-pay

## 2022-07-12 DIAGNOSIS — H6692 Otitis media, unspecified, left ear: Secondary | ICD-10-CM | POA: Diagnosis not present

## 2022-07-12 DIAGNOSIS — H6690 Otitis media, unspecified, unspecified ear: Secondary | ICD-10-CM

## 2022-07-12 DIAGNOSIS — H9202 Otalgia, left ear: Secondary | ICD-10-CM | POA: Diagnosis present

## 2022-07-12 MED ORDER — AMOXICILLIN 500 MG PO CAPS: 500.0000 mg | ORAL_CAPSULE | Freq: Three times a day (TID) | ORAL | 0 refills | Status: AC

## 2022-07-12 NOTE — Discharge Instructions (Addendum)
Return to the ED with any new or worsening signs or symptoms Please begin taking 500 mg of amoxicillin 3 times daily for the next 5 days for your infection Please read the attached guide concerning otitis media for further information on your diagnosis

## 2022-07-12 NOTE — ED Provider Notes (Signed)
[place] EMERGENCY DEPARTMENT AT [facility] Provider Note   CSN: [id number] Arrival date & time: 07/12/22  1728     History  Chief Complaint  Patient presents with   Otalgia    [name] is a 32 y.o. female with medical history anxiety, HPV, shingles.  Patient presents to the ED for evaluation of left-sided ear pain.  She reports that for the last 8 days she has had progressively worsening left-sided ear pain.  She believes that her left ear is clogged.  She is endorsing decreased hearing, pain in her ear.  She denies any fevers, nausea or vomiting, drainage from ear.  Denies recent swimming.  She states that she often wears air pods in her ears but denies wearing an air pod in her left ear.   Otalgia Associated symptoms: hearing loss   Associated symptoms: no ear discharge and no fever        Home Medications Prior to Admission medications   Medication Sig Start Date End Date Taking? Authorizing Provider  amoxicillin (AMOXIL) 500 MG capsule Take 1 capsule (500 mg total) by mouth 3 (three) times daily. 07/12/22  Yes Al Decant, PA-C  acetaminophen (TYLENOL) 500 MG tablet Take 1,000 mg by mouth every 6 (six) hours as needed.    [provider]  cholecalciferol (VITAMIN D3) 25 MCG (1000 UNIT) tablet Take 1,000 Units by mouth daily.    [provider]  EPINEPHrine (EPIPEN 2-PAK) 0.3 mg/0.3 mL IJ SOAJ injection Inject 0.3 mg into the muscle once as needed (for severe allergic reaction).    [provider]  ibuprofen (ADVIL) 600 MG tablet Take 1 tablet (600 mg total) by mouth every 6 (six) hours. 01/21/22   Dale Orchard, FNP  Prenatal Vit-Fe Fumarate-FA (MULTIVITAMIN-PRENATAL) 27-0.8 MG TABS tablet Take 1 tablet by mouth daily at 12 noon.    [provider]      Allergies    Bee venom and Tramadol    Review of Systems   Review of Systems  Constitutional:  Negative for fever.  HENT:  Positive for ear pain and hearing  loss. Negative for ear discharge.   All other systems reviewed and are negative.   Physical Exam Updated Vital Signs BP 128/69 (BP Location: Right Arm)   Pulse 65   Temp 98.9 F (37.2 C) (Oral)   Resp 17   Ht 5\' 1"  (1.549 m)   Wt 59 kg   SpO2 96%   BMI 24.56 kg/m  Physical Exam Vitals and nursing note reviewed.  Constitutional:      General: She is not in acute distress.    Appearance: She is well-developed.  HENT:     Head: Normocephalic and atraumatic.     Left Ear: Decreased hearing noted. A middle ear effusion is present. There is no impacted cerumen. Tympanic membrane is bulging.  Eyes:     Conjunctiva/sclera: Conjunctivae normal.  Cardiovascular:     Rate and Rhythm: Normal rate and regular rhythm.     Heart sounds: No murmur heard. Pulmonary:     Effort: Pulmonary effort is normal. No respiratory distress.     Breath sounds: Normal breath sounds.  Abdominal:     Palpations: Abdomen is soft.     Tenderness: There is no abdominal tenderness.  Musculoskeletal:        General: No swelling.     Cervical back: Neck supple.  Skin:    General: Skin is warm and dry.  Capillary Refill: Capillary refill takes less than 2 seconds.  Neurological:     Mental Status: She is alert.  Psychiatric:        Mood and Affect: Mood normal.     ED Results / Procedures / Treatments   Labs (all labs ordered are listed, but only abnormal results are displayed) Labs Reviewed - No data to display  EKG None  Radiology No results found.  Procedures Procedures   Medications Ordered in ED Medications - No data to display  ED Course/ Medical Decision Making/ A&P  Medical Decision Making  32 year old female presents to the ED for evaluation of left-sided ear pain.  Please see HPI for further details.  On examination the patient has a bulging tympanic membrane on the left concerning for otitis media.  She has no cerumen in her ear canals that I am able to appreciate.  Most  likely cause of patient pain due to acute otitis media.  Patient be placed on amoxicillin after milligrams 3 times daily for 5 days.  She will return to the ED with any new or worsening signs or symptoms.  She was given return precautions and she voiced understanding.  She had all of her questions answered to her satisfaction.  She is stable to discharge home.   Final Clinical Impression(s) / ED Diagnoses Final diagnoses:  Acute otitis media, unspecified otitis media type    Rx / DC Orders ED Discharge Orders          Ordered    amoxicillin (AMOXIL) 500 MG capsule  3 times daily        07/12/22 1829              Clent Ridges 07/12/22 1831    Lorre Nick, MD 07/13/22 1623

## 2022-07-12 NOTE — ED Triage Notes (Signed)
Patient is here for evaluation of left ear pain. Reports she thinks it is clogged and the pressure is causing her severe pain. States this has been going on about a week. No other complaints.

## 2023-03-14 ENCOUNTER — Telehealth: Payer: Self-pay | Admitting: General Practice

## 2023-03-14 ENCOUNTER — Ambulatory Visit: Payer: Medicaid Other | Admitting: Family Medicine

## 2023-03-14 NOTE — Telephone Encounter (Signed)
Called patient to reschedule missed new patient appointment no answer left voicemail

## 2023-04-05 ENCOUNTER — Other Ambulatory Visit: Payer: Self-pay | Admitting: Obstetrics and Gynecology

## 2023-04-07 LAB — SURGICAL PATHOLOGY
